# Patient Record
Sex: Male | Born: 1965
Health system: Southern US, Community
[De-identification: ages and names within clinical notes are randomized; demographics above are authoritative.]

## PROBLEM LIST (undated history)

## (undated) DIAGNOSIS — E785 Hyperlipidemia, unspecified: Secondary | ICD-10-CM

## (undated) DIAGNOSIS — M1990 Unspecified osteoarthritis, unspecified site: Secondary | ICD-10-CM

## (undated) DIAGNOSIS — N400 Enlarged prostate without lower urinary tract symptoms: Secondary | ICD-10-CM

## (undated) DIAGNOSIS — I83819 Varicose veins of unspecified lower extremities with pain: Secondary | ICD-10-CM

## (undated) DIAGNOSIS — I10 Essential (primary) hypertension: Secondary | ICD-10-CM

## (undated) DIAGNOSIS — M419 Scoliosis, unspecified: Secondary | ICD-10-CM

## (undated) HISTORY — PX: OTHER SURGICAL HISTORY: SHX169

## (undated) HISTORY — DX: Benign prostatic hyperplasia without lower urinary tract symptoms: N40.0

## (undated) HISTORY — PX: VASECTOMY: SHX75

## (undated) HISTORY — DX: Varicose veins of unspecified lower extremity with pain: I83.819

## (undated) HISTORY — DX: Scoliosis, unspecified: M41.9

## (undated) HISTORY — DX: Hyperlipidemia, unspecified: E78.5

## (undated) HISTORY — DX: Unspecified osteoarthritis, unspecified site: M19.90

## (undated) HISTORY — DX: Essential (primary) hypertension: I10

## (undated) HISTORY — PX: ENDOVENOUS ABLATION SAPHENOUS VEIN W/ LASER: SUR449

---

## 2010-06-18 ENCOUNTER — Encounter (INDEPENDENT_AMBULATORY_CARE_PROVIDER_SITE_OTHER): Payer: 59 | Admitting: Vascular Surgery

## 2010-06-18 DIAGNOSIS — I83893 Varicose veins of bilateral lower extremities with other complications: Secondary | ICD-10-CM

## 2010-06-19 NOTE — Consult Note (Signed)
NEW PATIENT CONSULTATION  JARREL, KNOKE DOB:  1965-08-11                                       06/18/2010 CHART#:[id number]  HISTORY OF PRESENT ILLNESS:  The patient presents today for evaluation of painful varicosities in his left calf.  He is a healthy, active 45- year-old white male, who has had progressive discomfort in his right calf with prolonged standing.  He has been wearing knee-high compression and this does give mild symptom relief while he is standing.  He is here today for further evaluation.  He does not have any history of DVT or superficial thrombophlebitis.  PAST HISTORY:  Significant for hypertension, elevated cholesterol.  SOCIAL HISTORY:  He is married with 2 children.  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  He does not smoke.  He does have social alcohol consumption.  FAMILY HISTORY:  Significant for atherosclerotic.  REVIEW OF SYSTEMS:  Negative for weight loss or weight gain.  He if 5 feet 8 inches tall and weighs 167 pounds, otherwise negative.  PHYSICAL EXAMINATION:  General:  Well-developed, well-nourished white male appearing stated age in no acute distress.  Vital signs: Blood pressure is 150/91, pulse 76, respirations 16.  HEENT:  Normal. Extremities:  He does have palpable radial and palpable dorsalis pedis pulses bilaterally.  He has no evidence of varicosities in his right leg.  Left leg does have typical calf vein varicosities arising below the knee in the medial aspect of his calf.  He does not have any significant swelling bilaterally.  He underwent screening SonoSite ultrasound by myself and this showed normal-caliber saphenous vein on the right leg.  He does have an enlarged saphenous vein with gross reflux on the left leg extending into the varicosities in his calf.  I discussed options with the patient.  He has been given a prescription for thigh-high graduated compression garments  20-to 30-mmHg and instructed on the use of these.  I do feel he would be an excellent candidate for laser ablation of the saphenous vein and stab phlebectomy and tributary varicosities for relief of symptoms should he fail conservative treatment.  He will continue with elevation as he is currently doing.  Hopefully with thigh-high compression, he will have better symptom relief.  We will see him again 3 months for a formal venous duplex and for further discussion.    Larina Earthly, M.D. Electronically Signed  TFE/MEDQ  D:  06/18/2010  T:  06/19/2010  Job:  [id number]

## 2010-09-24 ENCOUNTER — Ambulatory Visit: Payer: 59 | Admitting: Vascular Surgery

## 2010-10-15 ENCOUNTER — Ambulatory Visit: Payer: 59 | Admitting: Vascular Surgery

## 2010-10-29 ENCOUNTER — Encounter (INDEPENDENT_AMBULATORY_CARE_PROVIDER_SITE_OTHER): Payer: 59

## 2010-10-29 ENCOUNTER — Ambulatory Visit (INDEPENDENT_AMBULATORY_CARE_PROVIDER_SITE_OTHER): Payer: 59 | Admitting: Vascular Surgery

## 2010-10-29 DIAGNOSIS — I83893 Varicose veins of bilateral lower extremities with other complications: Secondary | ICD-10-CM

## 2010-10-29 DIAGNOSIS — M79609 Pain in unspecified limb: Secondary | ICD-10-CM

## 2010-10-30 NOTE — Assessment & Plan Note (Signed)
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  He has been compliant with compression garments, reports he continues to have difficulty despite the compression garment usage.  He stands for long periods of time and has difficulty related to leg pain and also cycles for exercise and has difficulty with this, as well, due to leg pain with prolonged cycling. He did undergo formal venous duplex today, and this does show reflux throughout his great saphenous vein extending into the varicosity in his medial calf.  Fortunately, he has a normal deep system with no evidence of reflux.  He has clearly failed conservative treatment and have recommended left leg laser ablation of his great saphenous vein from just below his knee to just above the saphenofemoral junction and stab phlebectomy of tributary varicosities.  I discussed the procedure to include potential rare risk of DVT.  He wishes to proceed when we can get him on our schedule.    Larina Earthly, M.D. Electronically Signed  TFE/MEDQ  D:  10/29/2010  T:  10/30/2010  Job:  [id number]

## 2010-11-13 NOTE — Procedures (Unsigned)
LOWER EXTREMITY VENOUS REFLUX EXAM  INDICATION:  Left lower extremity pain with varicose veins.  EXAM:  Using color-flow imaging and pulse Doppler spectral analysis, the left common femoral, superficial femoral, popliteal, posterior tibial, greater and lesser saphenous veins are evaluated.  There is no evidence suggesting deep venous insufficiency in the left lower extremity.  The left saphenofemoral junction is not competent with Reflux of >532milliseconds. The left GSV is not competent with Reflux of >574milliseconds with the caliber as described below.  The left proximal short saphenous vein demonstrates competency.  GSV Diameter (used if found to be incompetent only)                                           Right    Left Proximal Greater Saphenous Vein           cm       0.73 cm Proximal-to-mid-thigh                     cm       0.68 cm Mid thigh                                 cm       0.60 cm Mid-distal thigh                          cm       cm Distal thigh                              cm       0.63 cm Knee                                      cm       0.62 cm  IMPRESSION: 1. The left great saphenous vein is not competent with reflux     >545milliseconds. 2. The left greater saphenous vein is not tortuous. 3. The left deep venous system is competent. 4. The left small saphenous vein is competent.  ___________________________________________ Larina Earthly, M.D.  SH/MEDQ  D:  10/29/2010  T:  10/29/2010  Job:  [id number]

## 2010-11-21 ENCOUNTER — Encounter: Payer: Self-pay | Admitting: Vascular Surgery

## 2010-12-04 ENCOUNTER — Encounter: Payer: Self-pay | Admitting: Vascular Surgery

## 2010-12-04 ENCOUNTER — Ambulatory Visit (INDEPENDENT_AMBULATORY_CARE_PROVIDER_SITE_OTHER): Payer: 59 | Admitting: Vascular Surgery

## 2010-12-04 VITALS — BP 124/78 | HR 75 | Resp 18 | Ht 68.0 in | Wt 165.0 lb

## 2010-12-04 DIAGNOSIS — I83893 Varicose veins of bilateral lower extremities with other complications: Secondary | ICD-10-CM

## 2010-12-04 NOTE — Progress Notes (Deleted)
Subjective:     Patient ID: Kyle Schneider, male   DOB: 01-30-1966, 45 y.o.   MRN: [id number]  HPI   Review of Systems     Objective:   Physical Exam     Assessment:     ***    Plan:     ***

## 2010-12-04 NOTE — Progress Notes (Signed)
Laser Ablation Procedure      Date: 12/04/2010    Kyle Schneider DOB:1965/04/30  Consent signed: Yes  Surgeon:T.F. Christy Friede  Procedure: Laser Ablation: left Greater Saphenous Vein  BP 124/78  Pulse 75  Resp 18  Ht 5\' 8"  (1.727 m)  Wt 165 lb (74.844 kg)  BMI 25.09 kg/m2  Start time: 1:30PM   End time: 3:05 PM  Tumescent Anesthesia: 425 cc 0.9% NaCl with 50 cc Lidocaine HCL with 1% Epi and 15 cc 8.4% NaHCO3  Local Anesthesia: 2 cc Lidocaine HCL and NaHCO3 (ratio 2:1)   Continuous Mode: 15 watts    Total Energy 2315   Total time 2:34    Stab Phlebectomy: 10-20  Sites: Calf  Patient tolerated procedure well: Yes    .Description of Procedure:  After marking the course of the saphenous vein and the secondary varicosities in the standing position, the patient was placed on the operating table in the supine position, and the the left leg was prepped and draped in sterile fashion. Local anesthetic was administered, and under ultrasound guidance the saphenous vein was accessed with a micro needle and guide wire; then the micro puncture sheath was placed. A guide wire was inserted to the saphenofemoral junction, followed by a 5 french sheath.  The position of the sheath and then the laser fiber below the junction was confirmed using the ultrasound and visualization of the aiming beam.  Tumescent anesthesia was administered along the course of the saphenous vein using ultrasound guidance. Protective laser glasses were placed on the patient, and the laser was fired at at 15 watt continuous mode.  For a total of 2315 joules.  A steri strip was applied to the puncture site.  The patient was then put into Trendelenburg position.  Local anesthetic was utilized overlying the marked varicosities.  Greater than 10-20  stab wounds were made using the tip of an 11 blade; and using the vein hook,  The phlebectomies were performed using a hemostat to avulse these varicosities.  Adequate hemostasis was  achieved, and steri strips were applied to the stab wound.      ABD pads and thigh high compression stockings were applied.  Ace wrap bandages were applied over the phlebectomy sites and at the top of the saphenofemoral junction.  Blood loss was less than 15 cc.  The patient ambulated out of the operating room having tolerated the procedure well.  The patient underwent ablation from his proximal calf to saphenofemoral junction and 10 stab phlebectomy sites with no immediate complication.  [name], [name]  [name] F

## 2010-12-04 NOTE — Progress Notes (Deleted)
Subjective:     Patient ID: Kyle Schneider, male   DOB: [birth year], 45 y.o.   MRN: [id number]  HPILaser Ablation Procedure      Date: 12/04/2010    Kyle Schneider DOB:13-Aug-1965  Consent signed: Yes  Surgeon:T.F. Early  Procedure: Laser Ablation: left Greater Saphenous Vein  Start time: 130 PM   End time: ***  Tumescent Anesthesia: *** cc 0.9% NaCl with 50 cc Lidocaine HCL with 1% Epi and 15 cc 8.4% NaHCO3  Local Anesthesia: *** cc Lidocaine HCL and NaHCO3 (ratio 2:1)   Continuous Mode: 15 Watts     Total Energy ***   Total time ***   Stab Phlebectomy:  Sites: Calf  Patient tolerated procedure well: {yes/no:20286}  Notes: ***  .   Review of Systems     Objective:   Physical Exam       Assessment:     ***    Plan:     ***

## 2010-12-05 ENCOUNTER — Telehealth: Payer: Self-pay | Admitting: *Deleted

## 2010-12-05 ENCOUNTER — Encounter: Payer: Self-pay | Admitting: Vascular Surgery

## 2010-12-05 NOTE — Telephone Encounter (Signed)
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  Reminded Dr. Jeanie Sewer to keep compression dressing on for 48 hours then wear compression hose daytime for 2 weeks.  Reminded Dr. Jeanie Sewer of post ablation venous duplex and follow up with Dr. Arbie Cookey on 12-11-2010.

## 2010-12-10 ENCOUNTER — Encounter: Payer: Self-pay | Admitting: Vascular Surgery

## 2010-12-11 ENCOUNTER — Ambulatory Visit (INDEPENDENT_AMBULATORY_CARE_PROVIDER_SITE_OTHER): Payer: 59 | Admitting: Vascular Surgery

## 2010-12-11 ENCOUNTER — Ambulatory Visit (INDEPENDENT_AMBULATORY_CARE_PROVIDER_SITE_OTHER): Payer: 59

## 2010-12-11 ENCOUNTER — Encounter: Payer: Self-pay | Admitting: Vascular Surgery

## 2010-12-11 VITALS — BP 115/74 | HR 70 | Resp 20 | Ht 68.0 in | Wt 167.0 lb

## 2010-12-11 DIAGNOSIS — Z48812 Encounter for surgical aftercare following surgery on the circulatory system: Secondary | ICD-10-CM

## 2010-12-11 DIAGNOSIS — I83893 Varicose veins of bilateral lower extremities with other complications: Secondary | ICD-10-CM

## 2010-12-11 NOTE — Progress Notes (Signed)
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 He has had mild discomfort related to the procedure. He has been compliant with his compression garments.  Physical exam: Mild bruising in the thigh ablation site and the catheter phlebectomy site. All phlebectomy sites healing well.  Venous duplex: Successful ablation of left great saphenous vein, no evidence of DVT,  Impression and plan: Successful treatment of left leg venous hypertension. Followup on an when necessary basis.

## 2010-12-17 NOTE — Procedures (Unsigned)
DUPLEX DEEP VENOUS EXAM - LOWER EXTREMITY  INDICATION:  One week status post left GSV ablation.  HISTORY:  Edema:  No. Trauma/Surgery:  Left GSV ablation. Pain:  Minimal. PE:  No. Previous DVT:  No. Anticoagulants:  No. Other:  DUPLEX EXAM:               CFV   SFV   PopV  PTV    GSV               R  L  R  L  R  L  R   L  R  L Thrombosis       o     o     o      o     + Spontaneous      +     +     +            o Phasic           +     +     +            o Augmentation     +     +     +      +     o Compressible     +     +     +      + Competent        +     +     +      +     o  Legend:  + - yes  o - no  p - partial  D - decreased  IMPRESSION: 1. Successful ablation of left greater saphenous vein approximately 2     cm distal to the junction and through the proximal calf insertion     point. 2. No deep venous involvement was observed.   _____________________________ Larina Earthly, M.D.  LT/MEDQ  D:  12/11/2010  T:  12/11/2010  Job:  [id number]

## 2010-12-24 ENCOUNTER — Telehealth: Payer: Self-pay | Admitting: *Deleted

## 2010-12-24 NOTE — Telephone Encounter (Signed)
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  States he is experiencing no swelling in the left leg.  States he has decreased his exercise level and intensity.  States he is using Ibuprofen and ice compresses to manage the left leg pain with success.  Will review pt.'s concern with Dr. Arbie Cookey on 12-24-10.   [name], [name]

## 2010-12-26 ENCOUNTER — Telehealth: Payer: Self-pay | Admitting: *Deleted

## 2010-12-26 NOTE — Telephone Encounter (Signed)
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    Dr. Jeanie Sewer states that he has been wearing knee high compression, decreasing his exercise regimen, and taking Celebrex twice daily for the past week.  I communicated this message to Dr. Arbie Cookey.  Dr. Jeanie Sewer to call back if he has further questions or problems.    [name], [name]

## 2011-03-24 ENCOUNTER — Encounter: Payer: Self-pay | Admitting: Vascular Surgery

## 2015-08-15 DIAGNOSIS — R350 Frequency of micturition: Secondary | ICD-10-CM | POA: Insufficient documentation

## 2015-08-15 DIAGNOSIS — E782 Mixed hyperlipidemia: Secondary | ICD-10-CM | POA: Insufficient documentation

## 2015-08-15 DIAGNOSIS — R002 Palpitations: Secondary | ICD-10-CM

## 2015-08-15 DIAGNOSIS — K589 Irritable bowel syndrome without diarrhea: Secondary | ICD-10-CM | POA: Insufficient documentation

## 2015-08-15 DIAGNOSIS — M5137 Other intervertebral disc degeneration, lumbosacral region: Secondary | ICD-10-CM

## 2015-08-15 DIAGNOSIS — J309 Allergic rhinitis, unspecified: Secondary | ICD-10-CM

## 2015-08-15 DIAGNOSIS — N401 Enlarged prostate with lower urinary tract symptoms: Secondary | ICD-10-CM | POA: Insufficient documentation

## 2015-08-15 DIAGNOSIS — N3642 Intrinsic sphincter deficiency (ISD): Secondary | ICD-10-CM

## 2015-08-15 DIAGNOSIS — F419 Anxiety disorder, unspecified: Secondary | ICD-10-CM

## 2015-08-15 DIAGNOSIS — M51379 Other intervertebral disc degeneration, lumbosacral region without mention of lumbar back pain or lower extremity pain: Secondary | ICD-10-CM

## 2015-08-15 DIAGNOSIS — E663 Overweight: Secondary | ICD-10-CM

## 2015-08-15 DIAGNOSIS — K219 Gastro-esophageal reflux disease without esophagitis: Secondary | ICD-10-CM

## 2015-08-15 DIAGNOSIS — N433 Hydrocele, unspecified: Secondary | ICD-10-CM | POA: Insufficient documentation

## 2015-08-15 DIAGNOSIS — H409 Unspecified glaucoma: Secondary | ICD-10-CM

## 2015-08-15 DIAGNOSIS — M2170 Unequal limb length (acquired), unspecified site: Secondary | ICD-10-CM

## 2015-08-15 DIAGNOSIS — D126 Benign neoplasm of colon, unspecified: Secondary | ICD-10-CM | POA: Insufficient documentation

## 2015-08-15 HISTORY — DX: Unequal limb length (acquired), unspecified site: M21.70

## 2015-08-15 HISTORY — DX: Overweight: E66.3

## 2015-08-15 HISTORY — DX: Irritable bowel syndrome, unspecified: K58.9

## 2015-08-15 HISTORY — DX: Allergic rhinitis, unspecified: J30.9

## 2015-08-15 HISTORY — DX: Benign prostatic hyperplasia with lower urinary tract symptoms: N40.1

## 2015-08-15 HISTORY — DX: Benign neoplasm of colon, unspecified: D12.6

## 2015-08-15 HISTORY — DX: Palpitations: R00.2

## 2015-08-15 HISTORY — DX: Intrinsic sphincter deficiency (ISD): N36.42

## 2015-08-15 HISTORY — DX: Unspecified glaucoma: H40.9

## 2015-08-15 HISTORY — DX: Hydrocele, unspecified: N43.3

## 2015-08-15 HISTORY — DX: Anxiety disorder, unspecified: F41.9

## 2015-08-15 HISTORY — DX: Other intervertebral disc degeneration, lumbosacral region: M51.37

## 2015-08-15 HISTORY — DX: Other intervertebral disc degeneration, lumbosacral region without mention of lumbar back pain or lower extremity pain: M51.379

## 2015-08-15 HISTORY — DX: Gastro-esophageal reflux disease without esophagitis: K21.9

## 2016-05-30 DIAGNOSIS — E119 Type 2 diabetes mellitus without complications: Secondary | ICD-10-CM | POA: Insufficient documentation

## 2016-05-30 HISTORY — DX: Type 2 diabetes mellitus without complications: E11.9

## 2016-06-18 DIAGNOSIS — K648 Other hemorrhoids: Secondary | ICD-10-CM | POA: Insufficient documentation

## 2016-06-18 HISTORY — DX: Other hemorrhoids: K64.8

## 2016-07-24 DIAGNOSIS — Z79899 Other long term (current) drug therapy: Secondary | ICD-10-CM | POA: Insufficient documentation

## 2016-07-24 HISTORY — DX: Other long term (current) drug therapy: Z79.899

## 2017-02-11 ENCOUNTER — Encounter: Payer: Self-pay | Admitting: Cardiology

## 2017-02-11 ENCOUNTER — Ambulatory Visit (INDEPENDENT_AMBULATORY_CARE_PROVIDER_SITE_OTHER): Payer: BLUE CROSS/BLUE SHIELD | Admitting: Cardiology

## 2017-02-11 VITALS — BP 132/72 | HR 80 | Resp 10 | Ht 68.0 in | Wt 177.8 lb

## 2017-02-11 DIAGNOSIS — R7303 Prediabetes: Secondary | ICD-10-CM | POA: Diagnosis not present

## 2017-02-11 DIAGNOSIS — I10 Essential (primary) hypertension: Secondary | ICD-10-CM

## 2017-02-11 DIAGNOSIS — R0789 Other chest pain: Secondary | ICD-10-CM

## 2017-02-11 DIAGNOSIS — R079 Chest pain, unspecified: Secondary | ICD-10-CM

## 2017-02-11 DIAGNOSIS — E785 Hyperlipidemia, unspecified: Secondary | ICD-10-CM

## 2017-02-11 HISTORY — DX: Hyperlipidemia, unspecified: E78.5

## 2017-02-11 HISTORY — DX: Other chest pain: R07.89

## 2017-02-11 HISTORY — DX: Prediabetes: R73.03

## 2017-02-11 HISTORY — DX: Essential (primary) hypertension: I10

## 2017-02-11 NOTE — Progress Notes (Signed)
Cardiology Consultation:    Date:  02/11/2017   ID:  [name], DOB 02-01-1966, MRN [id number]  PCP:  Raina Mina., MD  Cardiologist:  Jenne Campus, MD   Referring MD: Raina Mina., MD   Chief Complaint  Patient presents with  . Chest Pain    Gone at this point  Chest pain  History of Present Illness:    Kyle Schneider is a 51 y.o. male who is being seen today for the evaluation of chest pain at the request of Raina Mina., MD.  Wilburn Mylar he started having chest pain it happened in the afternoon he described pain is located in the lower portion of his sternum without radiation grades the pain as a 3 in scale up to 10.  No radiation no sweating or shortness of breath associated with this sensation on and off lasting for a few minutes every time.  He eventually ended up getting to the emergency room.  There were no aggravating or relieving symptoms.  He had a normal EKG done in the emergency room, troponin I was checked was also normal.  Eventually he ended up coming to me this morning.  Doing well today with no symptoms.  Good night and slept well.  Obviously very concerned about his symptoms.  His risk factors for coronary artery disease include hypertension, prolonged diabetes, dyslipidemia.  There is also some family history of coronary artery disease.  Past Medical History:  Diagnosis Date  . Arthritis   . BPH (benign prostatic hyperplasia)   . Hyperlipidemia   . Hypertension   . Scoliosis   . Varicose veins of leg with pain left leg    Past Surgical History:  Procedure Laterality Date  . ENDOVENOUS ABLATION SAPHENOUS VEIN W/ LASER  12-04-2010 Left Greater SaphenousVein   . STAB PHLEBECTOMY  12-04-2010 LEFT LEG 10-20 INCISIONS  . VASECTOMY      Current Medications: Current Meds  Medication Sig  . dexlansoprazole (DEXILANT) 60 MG capsule Take 60 mg by mouth daily.  Marland Kitchen olmesartan (BENICAR) 20 MG tablet Take 20 mg by mouth daily.  . rosuvastatin (CRESTOR) 5  MG tablet Take 5 mg by mouth daily.  . tadalafil (CIALIS) 5 MG tablet Take 2.5 mg by mouth 2 (two) times daily.  . TRAVATAN Z 0.004 % SOLN ophthalmic solution instill 1 drop in both eyes every day at bedtime     Allergies:   Patient has no known allergies.   Social History   Social History  . Marital status: Unknown    Spouse name: N/A  . Number of children: N/A  . Years of education: N/A   Social History Main Topics  . Smoking status: Never Smoker  . Smokeless tobacco: Never Used  . Alcohol use Yes     Comment: 3-5 glasses of wine/beer weekly  . Drug use: No  . Sexual activity: Not Asked   Other Topics Concern  . None   Social History Narrative  . None     Family History: The patient's family history includes Alzheimer's disease in his mother; CAD in his father; Heart disease in his paternal grandfather; Lymphoma in his sister. ROS:   Please see the history of present illness.    All 14 point review of systems negative except as described per history of present illness.  EKGs/Labs/Other Studies Reviewed:    The following studies were reviewed today: I reviewed records from hospital.  EKG:  EKG is  ordered today.  The ekg  ordered today demonstrates showed normal sinus rhythm, normal interval, normal QS complex duration and morphology.  Recent Labs: No results found for requested labs within last 8760 hours.  Recent Lipid Panel No results found for: CHOL, TRIG, HDL, CHOLHDL, VLDL, LDLCALC, LDLDIRECT  Physical Exam:    VS:  BP 132/72   Pulse 80   Resp 10   Ht 5\' 8"  (1.727 m)   Wt 177 lb 12.8 oz (80.6 kg)   BMI 27.03 kg/m     Wt Readings from Last 3 Encounters:  02/11/17 177 lb 12.8 oz (80.6 kg)  12/11/10 167 lb (75.8 kg)  12/04/10 165 lb (74.8 kg)     GEN:  Well nourished, well developed in no acute distress HEENT: Normal NECK: No JVD; No carotid bruits LYMPHATICS: No lymphadenopathy CARDIAC: RRR, no murmurs, no rubs, no gallops RESPIRATORY:  Clear  to auscultation without rales, wheezing or rhonchi  ABDOMEN: Soft, non-tender, non-distended MUSCULOSKELETAL:  No edema; No deformity  SKIN: Warm and dry NEUROLOGIC:  Alert and oriented x 3 PSYCHIATRIC:  Normal affect   ASSESSMENT:    1. Chest pain, unspecified type   2. Atypical chest pain   3. Borderline diabetes   4. Dyslipidemia   5. Essential hypertension    PLAN:    In order of problems listed above:  1. Chest pain which is atypical.  But he does have significant risk factors for coronary artery disease.  Troponin eyes were normal.  He will be scheduled to have a stress test.  We will do it today.  At the moment of this dictation that he already had stress test done he walked 12 months and have negative test.  I think we need to reorient ourselves on the modification of his risk factors.  We already talked about management of his high blood pressure borderline diabetes, good diet and exercises on a regular basis. 2. 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  He is watching his diet and he plan to exercise he will be more aggressively.  He does have chronic back problem with back exercises somewhat difficult. 3. Dyslipidemia: He is taking 5 mg of Crestor I asked him to increase the dose to 10 mg. 4. Essential hypertension: Blood pressures well controlled continue present management.   Medication Adjustments/Labs and Tests Ordered: Current medicines are reviewed at length with the patient today.  Concerns regarding medicines are outlined above.  Orders Placed This Encounter  Procedures  . Exercise Tolerance Test   No orders of the defined types were placed in this encounter.   Signed, Park Liter, MD, [facility]. 02/11/2017 11:53 AM    [facility]

## 2017-02-11 NOTE — Patient Instructions (Signed)
Medication Instructions:  Your physician recommends that you continue on your current medications as directed. Please refer to the Current Medication list given to you today.  1. Avoid all over-the-counter antihistamines except Claritin/Loratadine and Zyrtec/Cetrizine. 2. Avoid all combination including cold sinus allergies flu decongestant and sleep medications 3. You can use Robitussin DM Mucinex and Mucinex DM for cough. 4. can use Tylenol aspirin ibuprofen and naproxen but no combinations such as sleep or sinus.  Labwork: None   Testing/Procedures: Your physician has requested that you have an exercise tolerance test. We will have this performed at [facility] today.    Follow-Up: Your physician recommends that you schedule a follow-up appointment in: 1 month   Any Other Special Instructions Will Be Listed Below (If Applicable).  Please note that any paperwork needing to be filled out by the provider will need to be addressed at the front desk prior to seeing the provider. Please note that any paperwork FMLA, Disability or other documents regarding health condition is subject to a $25.00 charge that must be received prior to completion of paperwork in the form of a money order or check.    If you need a refill on your cardiac medications before your next appointment, please call your pharmacy.

## 2017-02-12 NOTE — Addendum Note (Signed)
Addended by: Kathyrn Sheriff on: 02/12/2017 10:38 AM   Modules accepted: Orders

## 2017-05-18 DIAGNOSIS — H9193 Unspecified hearing loss, bilateral: Secondary | ICD-10-CM

## 2017-05-18 DIAGNOSIS — H6993 Unspecified Eustachian tube disorder, bilateral: Secondary | ICD-10-CM

## 2017-05-18 DIAGNOSIS — H6983 Other specified disorders of Eustachian tube, bilateral: Secondary | ICD-10-CM

## 2017-05-18 HISTORY — DX: Other specified disorders of eustachian tube, bilateral: H69.83

## 2017-05-18 HISTORY — DX: Unspecified eustachian tube disorder, bilateral: H69.93

## 2017-05-18 HISTORY — DX: Unspecified hearing loss, bilateral: H91.93

## 2019-05-27 DIAGNOSIS — Z01812 Encounter for preprocedural laboratory examination: Secondary | ICD-10-CM | POA: Diagnosis not present

## 2019-06-03 DIAGNOSIS — K208 Other esophagitis without bleeding: Secondary | ICD-10-CM | POA: Diagnosis not present

## 2019-06-03 DIAGNOSIS — K2970 Gastritis, unspecified, without bleeding: Secondary | ICD-10-CM | POA: Diagnosis not present

## 2019-06-03 DIAGNOSIS — Z79899 Other long term (current) drug therapy: Secondary | ICD-10-CM | POA: Diagnosis not present

## 2019-06-03 DIAGNOSIS — K2960 Other gastritis without bleeding: Secondary | ICD-10-CM | POA: Diagnosis not present

## 2019-06-03 DIAGNOSIS — N400 Enlarged prostate without lower urinary tract symptoms: Secondary | ICD-10-CM | POA: Diagnosis not present

## 2019-06-03 DIAGNOSIS — K648 Other hemorrhoids: Secondary | ICD-10-CM | POA: Diagnosis not present

## 2019-06-03 DIAGNOSIS — I10 Essential (primary) hypertension: Secondary | ICD-10-CM | POA: Diagnosis not present

## 2019-06-03 DIAGNOSIS — E119 Type 2 diabetes mellitus without complications: Secondary | ICD-10-CM | POA: Diagnosis not present

## 2019-06-03 DIAGNOSIS — K449 Diaphragmatic hernia without obstruction or gangrene: Secondary | ICD-10-CM | POA: Diagnosis not present

## 2019-06-03 DIAGNOSIS — K219 Gastro-esophageal reflux disease without esophagitis: Secondary | ICD-10-CM | POA: Diagnosis not present

## 2019-06-03 DIAGNOSIS — Z86010 Personal history of colonic polyps: Secondary | ICD-10-CM | POA: Diagnosis not present

## 2019-06-03 DIAGNOSIS — E785 Hyperlipidemia, unspecified: Secondary | ICD-10-CM | POA: Diagnosis not present

## 2019-06-03 DIAGNOSIS — Z800 Family history of malignant neoplasm of digestive organs: Secondary | ICD-10-CM | POA: Diagnosis not present

## 2019-06-03 DIAGNOSIS — Z7984 Long term (current) use of oral hypoglycemic drugs: Secondary | ICD-10-CM | POA: Diagnosis not present

## 2019-06-03 LAB — HM COLONOSCOPY

## 2019-06-29 DIAGNOSIS — Z0000 Encounter for general adult medical examination without abnormal findings: Secondary | ICD-10-CM | POA: Diagnosis not present

## 2019-06-29 DIAGNOSIS — E782 Mixed hyperlipidemia: Secondary | ICD-10-CM | POA: Diagnosis not present

## 2019-06-29 DIAGNOSIS — R748 Abnormal levels of other serum enzymes: Secondary | ICD-10-CM | POA: Diagnosis not present

## 2019-06-29 DIAGNOSIS — D126 Benign neoplasm of colon, unspecified: Secondary | ICD-10-CM | POA: Diagnosis not present

## 2019-06-29 DIAGNOSIS — E119 Type 2 diabetes mellitus without complications: Secondary | ICD-10-CM | POA: Diagnosis not present

## 2019-06-29 DIAGNOSIS — L209 Atopic dermatitis, unspecified: Secondary | ICD-10-CM

## 2019-06-29 DIAGNOSIS — K760 Fatty (change of) liver, not elsewhere classified: Secondary | ICD-10-CM | POA: Insufficient documentation

## 2019-06-29 DIAGNOSIS — N401 Enlarged prostate with lower urinary tract symptoms: Secondary | ICD-10-CM | POA: Diagnosis not present

## 2019-06-29 HISTORY — DX: Fatty (change of) liver, not elsewhere classified: K76.0

## 2019-06-29 HISTORY — DX: Atopic dermatitis, unspecified: L20.9

## 2019-07-04 DIAGNOSIS — S93491A Sprain of other ligament of right ankle, initial encounter: Secondary | ICD-10-CM | POA: Diagnosis not present

## 2019-07-08 DIAGNOSIS — H40003 Preglaucoma, unspecified, bilateral: Secondary | ICD-10-CM | POA: Diagnosis not present

## 2019-07-20 DIAGNOSIS — R748 Abnormal levels of other serum enzymes: Secondary | ICD-10-CM | POA: Diagnosis not present

## 2019-07-20 DIAGNOSIS — K7689 Other specified diseases of liver: Secondary | ICD-10-CM | POA: Diagnosis not present

## 2019-07-22 DIAGNOSIS — R748 Abnormal levels of other serum enzymes: Secondary | ICD-10-CM | POA: Diagnosis not present

## 2019-10-26 DIAGNOSIS — D2261 Melanocytic nevi of right upper limb, including shoulder: Secondary | ICD-10-CM | POA: Diagnosis not present

## 2019-10-26 DIAGNOSIS — L821 Other seborrheic keratosis: Secondary | ICD-10-CM | POA: Diagnosis not present

## 2019-10-26 DIAGNOSIS — D225 Melanocytic nevi of trunk: Secondary | ICD-10-CM | POA: Diagnosis not present

## 2019-10-26 DIAGNOSIS — L812 Freckles: Secondary | ICD-10-CM | POA: Diagnosis not present

## 2020-01-11 DIAGNOSIS — K760 Fatty (change of) liver, not elsewhere classified: Secondary | ICD-10-CM | POA: Diagnosis not present

## 2020-01-11 DIAGNOSIS — I10 Essential (primary) hypertension: Secondary | ICD-10-CM | POA: Diagnosis not present

## 2020-01-11 DIAGNOSIS — K219 Gastro-esophageal reflux disease without esophagitis: Secondary | ICD-10-CM | POA: Diagnosis not present

## 2020-01-11 DIAGNOSIS — E119 Type 2 diabetes mellitus without complications: Secondary | ICD-10-CM | POA: Diagnosis not present

## 2020-02-10 DIAGNOSIS — H40023 Open angle with borderline findings, high risk, bilateral: Secondary | ICD-10-CM | POA: Diagnosis not present

## 2020-02-22 DIAGNOSIS — M25572 Pain in left ankle and joints of left foot: Secondary | ICD-10-CM | POA: Diagnosis not present

## 2020-03-26 DIAGNOSIS — Z77122 Contact with and (suspected) exposure to noise: Secondary | ICD-10-CM | POA: Diagnosis not present

## 2020-03-26 DIAGNOSIS — H9312 Tinnitus, left ear: Secondary | ICD-10-CM | POA: Diagnosis not present

## 2020-03-26 DIAGNOSIS — H6121 Impacted cerumen, right ear: Secondary | ICD-10-CM | POA: Diagnosis not present

## 2020-03-26 DIAGNOSIS — H903 Sensorineural hearing loss, bilateral: Secondary | ICD-10-CM | POA: Diagnosis not present

## 2020-06-05 ENCOUNTER — Other Ambulatory Visit: Payer: Self-pay

## 2020-06-05 DIAGNOSIS — E785 Hyperlipidemia, unspecified: Secondary | ICD-10-CM | POA: Insufficient documentation

## 2020-06-05 DIAGNOSIS — I83819 Varicose veins of unspecified lower extremities with pain: Secondary | ICD-10-CM | POA: Insufficient documentation

## 2020-06-05 DIAGNOSIS — M419 Scoliosis, unspecified: Secondary | ICD-10-CM | POA: Insufficient documentation

## 2020-06-05 DIAGNOSIS — N400 Enlarged prostate without lower urinary tract symptoms: Secondary | ICD-10-CM | POA: Insufficient documentation

## 2020-06-05 DIAGNOSIS — I10 Essential (primary) hypertension: Secondary | ICD-10-CM | POA: Insufficient documentation

## 2020-06-05 DIAGNOSIS — M1990 Unspecified osteoarthritis, unspecified site: Secondary | ICD-10-CM | POA: Insufficient documentation

## 2020-06-06 ENCOUNTER — Other Ambulatory Visit: Payer: Self-pay

## 2020-06-06 ENCOUNTER — Ambulatory Visit (INDEPENDENT_AMBULATORY_CARE_PROVIDER_SITE_OTHER): Payer: BC Managed Care – PPO | Admitting: Cardiology

## 2020-06-06 ENCOUNTER — Encounter: Payer: Self-pay | Admitting: Cardiology

## 2020-06-06 VITALS — BP 108/68 | HR 73 | Ht 68.0 in | Wt 179.0 lb

## 2020-06-06 DIAGNOSIS — R079 Chest pain, unspecified: Secondary | ICD-10-CM

## 2020-06-06 DIAGNOSIS — R0789 Other chest pain: Secondary | ICD-10-CM

## 2020-06-06 DIAGNOSIS — R7303 Prediabetes: Secondary | ICD-10-CM

## 2020-06-06 DIAGNOSIS — I10 Essential (primary) hypertension: Secondary | ICD-10-CM

## 2020-06-06 DIAGNOSIS — R002 Palpitations: Secondary | ICD-10-CM

## 2020-06-06 DIAGNOSIS — E785 Hyperlipidemia, unspecified: Secondary | ICD-10-CM

## 2020-06-06 MED ORDER — METOPROLOL TARTRATE 100 MG PO TABS
ORAL_TABLET | ORAL | 0 refills | Status: DC
Start: 2020-06-06 — End: 2021-06-15

## 2020-06-06 NOTE — Patient Instructions (Signed)
Medication Instructions:  Your physician recommends that you continue on your current medications as directed. Please refer to the Current Medication list given to you today.  *If you need a refill on your cardiac medications before your next appointment, please call your pharmacy*   Lab Work: Your physician recommends that you return for lab work today: troponin   Your physician recommends that you return for lab work 3-7 days before ct : bmp   If you have labs (blood work) drawn today and your tests are completely normal, you will receive your results only by: Marland Kitchen MyChart Message (if you have MyChart) OR . A paper copy in the mail If you have any lab test that is abnormal or we need to change your treatment, we will call you to review the results.   Testing/Procedures: Your cardiac CT will be scheduled at one of the below locations:   [facility] [address] [place], [place] [address] [phone]  [place] [address] [place], [place] [address] [phone]  If scheduled at [facility], please arrive at the [facility] main entrance (entrance A) of [facility] 30 minutes prior to test start time. Proceed to the [facility] Radiology Department (first floor) to check-in and test prep.  If scheduled at [facility], please arrive 15 mins early for check-in and test prep.  Please follow these instructions carefully (unless otherwise directed):  Hold all erectile dysfunction medications at least 3 days (72 hrs) prior to test.  On the Night Before the Test: . Be sure to Drink plenty of water. . Do not consume any caffeinated/decaffeinated beverages or chocolate 12 hours prior to your test. . Do not take any antihistamines 12 hours prior to your test. On the Day of the Test: . Drink plenty of water until 1 hour prior to the test. . Do not eat any food 4 hours prior  to the test. . You may take your regular medications prior to the test.  . Take metoprolol (Lopressor) two hours prior to test. . HOLD Metformin the day of the test and 48 hours after.          After the Test: . Drink plenty of water. . After receiving IV contrast, you may experience a mild flushed feeling. This is normal. . On occasion, you may experience a mild rash up to 24 hours after the test. This is not dangerous. If this occurs, you can take Benadryl 25 mg and increase your fluid intake. . If you experience trouble breathing, this can be serious. If it is severe call 911 IMMEDIATELY. If it is mild, please call our office. . If you take any of these medications: Glipizide/Metformin, Avandament, Glucavance, please do not take 48 hours after completing test unless otherwise instructed.   Once we have confirmed authorization from your insurance company, we will call you to set up a date and time for your test. Based on how quickly your insurance processes prior authorizations requests, please allow up to 4 weeks to be contacted for scheduling your Cardiac CT appointment. Be advised that routine Cardiac CT appointments could be scheduled as many as 8 weeks after your provider has ordered it.  For non-scheduling related questions, please contact the cardiac imaging nurse navigator should you have any questions/concerns: Marchia Bond, Cardiac Imaging Nurse Navigator Gordy Clement, Cardiac Imaging Nurse Navigator [facility] Direct Office Dial: [phone]  For scheduling needs, including cancellations and rescheduling, please call [place], [phone].     Follow-Up: At [facility], you and your health needs are our priority.  As part of our continuing mission to provide you with exceptional heart care, we have created designated Provider Care Teams.  These Care Teams include your primary Cardiologist (physician) and Advanced Practice Providers (APPs  -  Physician Assistants and Nurse Practitioners) who all work together to provide you with the care you need, when you need it.  We recommend signing up for the patient portal called "MyChart".  Sign up information is provided on this After Visit Summary.  MyChart is used to connect with patients for Virtual Visits (Telemedicine).  Patients are able to view lab/test results, encounter notes, upcoming appointments, etc.  Non-urgent messages can be sent to your provider as well.   To learn more about what you can do with MyChart, go to [email or web address].    Your next appointment:   6 week(s)  The format for your next appointment:   In Person  Provider:   Jenne Campus, MD   Other Instructions   Cardiac CT Angiogram A cardiac CT angiogram is a procedure to look at the heart and the area around the heart. It may be done to help find the cause of chest pains or other symptoms of heart disease. During this procedure, a substance called contrast dye is injected into the blood vessels in the area to be checked. A large X-ray machine, called a CT scanner, then takes detailed pictures of the heart and the surrounding area. The procedure is also sometimes called a coronary CT angiogram, coronary artery scanning, or CTA. A cardiac CT angiogram allows the health care provider to see how well blood is flowing to and from the heart. The health care provider will be able to see if there are any problems, such as:  Blockage or narrowing of the coronary arteries in the heart.  Fluid around the heart.  Signs of weakness or disease in the muscles, valves, and tissues of the heart. Tell a health care provider about:  Any allergies you have. This is especially important if you have had a previous allergic reaction to contrast dye.  All medicines you are taking, including vitamins, herbs, eye drops, creams, and over-the-counter medicines.  Any blood disorders you have.  Any surgeries you have  had.  Any medical conditions you have.  Whether you are pregnant or may be pregnant.  Any anxiety disorders, chronic pain, or other conditions you have that may increase your stress or prevent you from lying still. What are the risks? Generally, this is a safe procedure. However, problems may occur, including:  Bleeding.  Infection.  Allergic reactions to medicines or dyes.  Damage to other structures or organs.  Kidney damage from the contrast dye that is used.  Increased risk of cancer from radiation exposure. This risk is low. Talk with your health care provider about: ? The risks and benefits of testing. ? How you can receive the lowest dose of radiation. What happens before the procedure?  Wear comfortable clothing and remove any jewelry, glasses, dentures, and hearing aids.  Follow instructions from your health care provider about eating and drinking. This may include: ? For 12 hours before the procedure -- avoid caffeine. This includes tea, coffee, soda, energy drinks, and diet pills. Drink plenty of water or other fluids that do not have caffeine in them. Being well hydrated can prevent complications. ? For 4-6  hours before the procedure -- stop eating and drinking. The contrast dye can cause nausea, but this is less likely if your stomach is empty.  Ask your health care provider about changing or stopping your regular medicines. This is especially important if you are taking diabetes medicines, blood thinners, or medicines to treat problems with erections (erectile dysfunction). What happens during the procedure?  Hair on your chest may need to be removed so that small sticky patches called electrodes can be placed on your chest. These will transmit information that helps to monitor your heart during the procedure.  An IV will be inserted into one of your veins.  You might be given a medicine to control your heart rate during the procedure. This will help to ensure that  good images are obtained.  You will be asked to lie on an exam table. This table will slide in and out of the CT machine during the procedure.  Contrast dye will be injected into the IV. You might feel warm, or you may get a metallic taste in your mouth.  You will be given a medicine called nitroglycerin. This will relax or dilate the arteries in your heart.  The table that you are lying on will move into the CT machine tunnel for the scan.  The person running the machine will give you instructions while the scans are being done. You may be asked to: ? Keep your arms above your head. ? Hold your breath. ? Stay very still, even if the table is moving.  When the scanning is complete, you will be moved out of the machine.  The IV will be removed. The procedure may vary among health care providers and hospitals.   What can I expect after the procedure? After your procedure, it is common to have:  A metallic taste in your mouth from the contrast dye.  A feeling of warmth.  A headache from the nitroglycerin. Follow these instructions at home:  Take over-the-counter and prescription medicines only as told by your health care provider.  If you are told, drink enough fluid to keep your urine pale yellow. This will help to flush the contrast dye out of your body.  Most people can return to their normal activities right after the procedure. Ask your health care provider what activities are safe for you.  It is up to you to get the results of your procedure. Ask your health care provider, or the department that is doing the procedure, when your results will be ready.  Keep all follow-up visits as told by your health care provider. This is important. Contact a health care provider if:  You have any symptoms of allergy to the contrast dye. These include: ? Shortness of breath. ? Rash or hives. ? A racing heartbeat. Summary  A cardiac CT angiogram is a procedure to look at the heart  and the area around the heart. It may be done to help find the cause of chest pains or other symptoms of heart disease.  During this procedure, a large X-ray machine, called a CT scanner, takes detailed pictures of the heart and the surrounding area after a contrast dye has been injected into blood vessels in the area.  Ask your health care provider about changing or stopping your regular medicines before the procedure. This is especially important if you are taking diabetes medicines, blood thinners, or medicines to treat erectile dysfunction.  If you are told, drink enough fluid to keep your urine  pale yellow. This will help to flush the contrast dye out of your body. This information is not intended to replace advice given to you by your health care provider. Make sure you discuss any questions you have with your health care provider. Document Revised: 11/24/2018 Document Reviewed: 11/24/2018 Elsevier Patient Education  2021 [employer].

## 2020-06-06 NOTE — Progress Notes (Signed)
Cardiology Consultation:    Date:  06/06/2020   ID:  Kyle Schneider, DOB 1965-10-17, MRN [id number]  PCP:  Raina Mina., MD  Cardiologist:  Jenne Campus, MD   Referring MD: Raina Mina., MD   Chief Complaint  Patient presents with  . Chest Pain    History of Present Illness:    Kyle Schneider is a 55 y.o. male who is being seen today for the evaluation of chest pain at the request of Raina Mina., MD.  His past medical history significant for essential hypertension, dyslipidemia, nonalcoholic fatty liver, borderline diabetes.  He requested to be seen because of episode of chest pain.  He said for about a week he experiencing tightness in the chest which is not related to exercise.  He graded sensation as moderate, there is no shortness of breath no sweating associated with this.  Taking deep breath may help a little bit.  Duration of the sensations for about half an hour.  At the same time he was able to exercise and walk with no difficulty.  Recently he ended going vacation and have no problem during that time.  At the moment of my interview he is perfectly asymptomatic.  His EKG does not show any changes. Risk factors for coronary artery disease include family history of coronary artery disease with some premature. Dyslipidemia: However well managed. Essential hypertension which is very well controlled. Borderline diabetes which is also very nicely controlled. He does not smoke. I did see him in 2019 where he had a stress test done he walked 12 minutes on the treadmill with no problems.  Past Medical History:  Diagnosis Date  . Allergic rhinitis 08/15/2015  . Anxiety disorder 08/15/2015  . Arthritis   . Atopic dermatitis 06/29/2019  . Atypical chest pain 02/11/2017  . Benign neoplasm of colon 08/15/2015   Formatting of this note might be different from the original. [place].  . Benign prostatic hyperplasia with urinary frequency 08/15/2015  . Bilateral hearing loss 05/18/2017   . Borderline diabetes 02/11/2017  . BPH (benign prostatic hyperplasia)   . DDD (degenerative disc disease), lumbosacral 08/15/2015  . Dyslipidemia 02/11/2017  . Essential hypertension 02/11/2017  . ETD (Eustachian tube dysfunction), bilateral 05/18/2017  . GERD without esophagitis 08/15/2015  . Glaucoma 08/15/2015  . Hemorrhoids, internal, with bleeding 06/18/2016  . High risk medication use 07/24/2016  . Hydrocele of testis 08/15/2015  . Hyperlipidemia   . Hypertension   . IBS (irritable bowel syndrome) 08/15/2015  . Intrinsic sphincter deficiency 08/15/2015  . Nonalcoholic fatty liver disease without nonalcoholic steatohepatitis (NA[facility]) 06/29/2019   Formatting of this note might be different from the original. Abnormal LFTs. [place] with Fatty liver.  Marland Kitchen Overweight 08/15/2015  . Palpitations 08/15/2015  . Scoliosis   . Type 2 diabetes mellitus without complication, without long-term current use of insulin ([place]) 05/30/2016   Formatting of this note might be different from the original. A1C=6.5 on 05/2017  . Unequal leg length 08/15/2015  . Varicose veins of leg with pain left leg    Past Surgical History:  Procedure Laterality Date  . ENDOVENOUS ABLATION SAPHENOUS VEIN W/ LASER  12-04-2010 Left Greater SaphenousVein   . STAB PHLEBECTOMY  12-04-2010 LEFT LEG 10-20 INCISIONS  . VASECTOMY      Current Medications: Current Meds  Medication Sig  . amLODipine (NORVASC) 10 MG tablet Take 5 mg by mouth daily.  . bimatoprost (LUMIGAN) 0.01 % SOLN Place 1 drop into both eyes daily.  Marland Kitchen  esomeprazole (NEXIUM) 20 MG packet Take 20 mg by mouth daily.  . famotidine (PEPCID) 40 MG tablet Take 40 mg by mouth at bedtime.  . metFORMIN (GLUCOPHAGE) 500 MG tablet Take 500 mg by mouth daily.  . metoprolol tartrate (LOPRESSOR) 100 MG tablet Take 2 hours before ct  . olmesartan (BENICAR) 20 MG tablet Take 20 mg by mouth daily.  . rosuvastatin (CRESTOR) 10 MG tablet Take 10 mg by mouth at bedtime.  . tadalafil (CIALIS) 5 MG  tablet Take 2.5 mg by mouth 2 (two) times daily.  . TRAVATAN Z 0.004 % SOLN ophthalmic solution instill 1 drop in both eyes every day at bedtime  . [DISCONTINUED] psyllium (REGULOID) 0.52 g capsule Take 0.52 g by mouth daily.  . [DISCONTINUED] rosuvastatin (CRESTOR) 5 MG tablet Take 5 mg by mouth daily.     Allergies:   Patient has no known allergies.   Social History   Socioeconomic History  . Marital status: Unknown    Spouse name: Not on file  . Number of children: Not on file  . Years of education: Not on file  . Highest education level: Not on file  Occupational History  . Not on file  Tobacco Use  . Smoking status: Never Smoker  . Smokeless tobacco: Never Used  Vaping Use  . Vaping Use: Never used  Substance and Sexual Activity  . Alcohol use: Yes    Comment: 3-5 glasses of wine/beer weekly  . Drug use: No  . Sexual activity: Not on file  Other Topics Concern  . Not on file  Social History Narrative  . Not on file   Social Determinants of Health   Financial Resource Strain: Not on file  Food Insecurity: Not on file  Transportation Needs: Not on file  Physical Activity: Not on file  Stress: Not on file  Social Connections: Not on file     Family History: The patient's family history includes Alzheimer's disease in his mother; CAD in his father; Heart disease in his paternal grandfather; Lymphoma in his sister. ROS:   Please see the history of present illness.    All 14 point review of systems negative except as described per history of present illness.  EKGs/Labs/Other Studies Reviewed:    The following studies were reviewed today:   EKG:  EKG is  ordered today.  The ekg ordered today demonstrates normal sinus rhythm, normal P interval, normal QS complex duration morphology no ST segment changes.  Recent Labs: No results found for requested labs within last 8760 hours.  Recent Lipid Panel No results found for: CHOL, TRIG, HDL, CHOLHDL, VLDL, LDLCALC,  LDLDIRECT  Physical Exam:    VS:  BP 108/68 (BP Location: Right Arm, Patient Position: Sitting)   Pulse 73   Ht 5\' 8"  (1.727 m)   Wt 179 lb (81.2 kg)   SpO2 95%   BMI 27.22 kg/m     Wt Readings from Last 3 Encounters:  06/06/20 179 lb (81.2 kg)  02/11/17 177 lb 12.8 oz (80.6 kg)  12/11/10 167 lb (75.8 kg)     GEN:  Well nourished, well developed in no acute distress HEENT: Normal NECK: No JVD; No carotid bruits LYMPHATICS: No lymphadenopathy CARDIAC: RRR, no murmurs, no rubs, no gallops RESPIRATORY:  Clear to auscultation without rales, wheezing or rhonchi  ABDOMEN: Soft, non-tender, non-distended MUSCULOSKELETAL:  No edema; No deformity  SKIN: Warm and dry NEUROLOGIC:  Alert and oriented x 3 PSYCHIATRIC:  Normal affect   ASSESSMENT:  1. Chest pain of uncertain etiology   2. Atypical chest pain   3. Palpitations   4. Essential hypertension   5. Borderline diabetes   6. Dyslipidemia    PLAN:    In order of problems listed above:  1. Atypical chest pain.  He does have risk factors for coronary artery disease, I asked him to start taking 1 baby aspirin every single day.  I told him to let me know if pain became more frequent.  In terms of evaluating for possibility of coronary artery disease we debated how to perform the test that will be able to ask the question have a problem now.  We elected to proceed with coronary CT. 2. Palpitations denies having any. 3. Essential hypertension well-controlled with his blood pressure 108/68 today. 4. Dyslipidemia I did review his cholesterol which look excellent.  We will continue present management which involve giving Crestor. 5. We did talk about healthy lifestyle need to exercise on the regular basis which she already does likely he is asymptomatic while exercising.   Medication Adjustments/Labs and Tests Ordered: Current medicines are reviewed at length with the patient today.  Concerns regarding medicines are outlined  above.  Orders Placed This Encounter  Procedures  . CT CORONARY MORPH W/CTA COR W/SCORE W/CA W/CM &/OR WO/CM  . CT CORONARY FRACTIONAL FLOW RESERVE DATA PREP  . CT CORONARY FRACTIONAL FLOW RESERVE FLUID ANALYSIS  . Troponin T  . Basic metabolic panel  . EKG 12-Lead   Meds ordered this encounter  Medications  . metoprolol tartrate (LOPRESSOR) 100 MG tablet    Sig: Take 2 hours before ct    Dispense:  1 tablet    Refill:  0    Signed, Park Liter, MD, [facility]. 06/06/2020 2:58 PM    [place]

## 2020-06-06 NOTE — Progress Notes (Signed)
ekg 

## 2020-06-07 LAB — TROPONIN T: Troponin T (Highly Sensitive): <6 ng/L (ref 0–22)

## 2020-07-16 ENCOUNTER — Telehealth (HOSPITAL_COMMUNITY): Payer: Self-pay | Admitting: Emergency Medicine

## 2020-07-16 NOTE — Telephone Encounter (Signed)
Attempted to call patient regarding upcoming cardiac CT appointment. °Left message on voicemail with name and callback number °Aggie Douse RN Navigator Cardiac Imaging °[facility] °[phone] Office °[phone] Cell ° °

## 2020-07-16 NOTE — Telephone Encounter (Signed)
Reaching out to patient to offer assistance regarding upcoming cardiac imaging study; pt verbalizes understanding of appt date/time, parking situation and where to check in, pre-test NPO status and medications ordered, and verified current allergies; name and call back number provided for further questions should they arise Marchia Bond RN Navigator Cardiac Imaging [facility] [phone] office [phone] cell   Holding cialis x 48 hr and taking 100mg  metoprolol tartrate  Clarise Cruz

## 2020-07-18 ENCOUNTER — Other Ambulatory Visit: Payer: Self-pay

## 2020-07-18 ENCOUNTER — Ambulatory Visit (HOSPITAL_COMMUNITY)
Admission: RE | Admit: 2020-07-18 | Discharge: 2020-07-18 | Disposition: A | Discharge status: Home / Self Care | Payer: BC Managed Care – PPO | Source: Ambulatory Visit | Attending: Cardiology | Admitting: Cardiology

## 2020-07-18 DIAGNOSIS — R079 Chest pain, unspecified: Secondary | ICD-10-CM

## 2020-07-18 MED ORDER — SODIUM CHLORIDE 0.9 % IV BOLUS: 500.0000 mL | Freq: Once | INTRAVENOUS | Status: DC

## 2020-07-18 MED ORDER — SODIUM CHLORIDE 0.9 % IV BOLUS
500.0000 mL | Freq: Once | INTRAVENOUS | Status: AC
  Administered 2020-07-18: 500 mL via INTRAVENOUS

## 2020-07-18 MED ORDER — NITROGLYCERIN 0.4 MG SL SUBL: 0.8000 mg | SUBLINGUAL_TABLET | Freq: Once | SUBLINGUAL | Status: AC

## 2020-07-18 MED ORDER — IOHEXOL 350 MG/ML SOLN
80.0000 mL | Freq: Once | INTRAVENOUS | Status: AC | PRN
  Administered 2020-07-18: 80 mL via INTRAVENOUS

## 2020-07-18 MED ORDER — NITROGLYCERIN 0.4 MG SL SUBL
SUBLINGUAL_TABLET | SUBLINGUAL | Status: AC
  Administered 2020-07-18: 0.8 mg via SUBLINGUAL
  Filled 2020-07-18: qty 2

## 2020-07-18 NOTE — Progress Notes (Incomplete)
   07/18/20 0824  Vital Signs  Pulse Rate (!) 56  BP (!) 77/54  MEWS Score  MEWS Temp 0  MEWS Systolic 2  MEWS Pulse 0  MEWS RR 0  MEWS LOC 0  MEWS Score 2  MEWS Score Color Yellow   Blood pressure rechecked after snack given and caffeine given. Patient states he feels dizzy. Dr. Agustin Cree informed and order given for 56mL NS bolus. Will reassess  0830- After bolus completed, patient BP: 87/63, HR 49. Patient states he feels slightly better sitting in chair but felt dizzy after standing. Dr. Agustin Cree informed of BP post bolus and additional order for 524mL NS bolus given. Will reassess.

## 2020-07-25 DIAGNOSIS — D126 Benign neoplasm of colon, unspecified: Secondary | ICD-10-CM | POA: Diagnosis not present

## 2020-07-25 DIAGNOSIS — K7581 Nonalcoholic steatohepatitis (NASH): Secondary | ICD-10-CM | POA: Diagnosis not present

## 2020-07-25 DIAGNOSIS — Z0000 Encounter for general adult medical examination without abnormal findings: Secondary | ICD-10-CM | POA: Diagnosis not present

## 2020-07-25 DIAGNOSIS — R079 Chest pain, unspecified: Secondary | ICD-10-CM | POA: Insufficient documentation

## 2020-07-25 DIAGNOSIS — R2689 Other abnormalities of gait and mobility: Secondary | ICD-10-CM | POA: Diagnosis not present

## 2020-07-25 DIAGNOSIS — M5459 Other low back pain: Secondary | ICD-10-CM | POA: Diagnosis not present

## 2020-07-25 DIAGNOSIS — G8929 Other chronic pain: Secondary | ICD-10-CM | POA: Diagnosis not present

## 2020-07-25 DIAGNOSIS — I10 Essential (primary) hypertension: Secondary | ICD-10-CM | POA: Diagnosis not present

## 2020-07-25 DIAGNOSIS — K219 Gastro-esophageal reflux disease without esophagitis: Secondary | ICD-10-CM | POA: Diagnosis not present

## 2020-08-10 DIAGNOSIS — H40023 Open angle with borderline findings, high risk, bilateral: Secondary | ICD-10-CM | POA: Diagnosis not present

## 2020-11-07 DIAGNOSIS — D225 Melanocytic nevi of trunk: Secondary | ICD-10-CM | POA: Diagnosis not present

## 2020-11-07 DIAGNOSIS — L812 Freckles: Secondary | ICD-10-CM | POA: Diagnosis not present

## 2020-11-07 DIAGNOSIS — D1801 Hemangioma of skin and subcutaneous tissue: Secondary | ICD-10-CM | POA: Diagnosis not present

## 2020-11-07 DIAGNOSIS — L821 Other seborrheic keratosis: Secondary | ICD-10-CM | POA: Diagnosis not present

## 2020-11-07 DIAGNOSIS — L570 Actinic keratosis: Secondary | ICD-10-CM | POA: Diagnosis not present

## 2020-12-21 DIAGNOSIS — Z23 Encounter for immunization: Secondary | ICD-10-CM | POA: Diagnosis not present

## 2021-02-05 DIAGNOSIS — E119 Type 2 diabetes mellitus without complications: Secondary | ICD-10-CM | POA: Diagnosis not present

## 2021-02-05 DIAGNOSIS — K7581 Nonalcoholic steatohepatitis (NASH): Secondary | ICD-10-CM | POA: Diagnosis not present

## 2021-02-05 DIAGNOSIS — K219 Gastro-esophageal reflux disease without esophagitis: Secondary | ICD-10-CM | POA: Diagnosis not present

## 2021-02-05 DIAGNOSIS — I10 Essential (primary) hypertension: Secondary | ICD-10-CM | POA: Diagnosis not present

## 2021-05-08 DIAGNOSIS — H40003 Preglaucoma, unspecified, bilateral: Secondary | ICD-10-CM | POA: Diagnosis not present

## 2021-05-27 DIAGNOSIS — H9193 Unspecified hearing loss, bilateral: Secondary | ICD-10-CM | POA: Diagnosis not present

## 2021-05-27 DIAGNOSIS — Z8669 Personal history of other diseases of the nervous system and sense organs: Secondary | ICD-10-CM | POA: Diagnosis not present

## 2021-05-27 DIAGNOSIS — H6121 Impacted cerumen, right ear: Secondary | ICD-10-CM | POA: Diagnosis not present

## 2021-06-12 DIAGNOSIS — K5730 Diverticulosis of large intestine without perforation or abscess without bleeding: Secondary | ICD-10-CM

## 2021-06-12 HISTORY — DX: Diverticulosis of large intestine without perforation or abscess without bleeding: K57.30

## 2021-06-15 ENCOUNTER — Emergency Department (HOSPITAL_COMMUNITY): Payer: BC Managed Care – PPO

## 2021-06-15 ENCOUNTER — Encounter (HOSPITAL_COMMUNITY): Payer: Self-pay | Admitting: Emergency Medicine

## 2021-06-15 ENCOUNTER — Emergency Department (HOSPITAL_COMMUNITY)
Admission: EM | Admit: 2021-06-15 | Discharge: 2021-06-15 | Disposition: A | Discharge status: Home / Self Care | Payer: BC Managed Care – PPO | Attending: Emergency Medicine | Admitting: Emergency Medicine

## 2021-06-15 ENCOUNTER — Other Ambulatory Visit: Payer: Self-pay

## 2021-06-15 DIAGNOSIS — R1013 Epigastric pain: Secondary | ICD-10-CM | POA: Insufficient documentation

## 2021-06-15 DIAGNOSIS — R1033 Periumbilical pain: Secondary | ICD-10-CM | POA: Diagnosis not present

## 2021-06-15 DIAGNOSIS — E119 Type 2 diabetes mellitus without complications: Secondary | ICD-10-CM | POA: Diagnosis not present

## 2021-06-15 DIAGNOSIS — R1011 Right upper quadrant pain: Secondary | ICD-10-CM | POA: Diagnosis not present

## 2021-06-15 DIAGNOSIS — K760 Fatty (change of) liver, not elsewhere classified: Secondary | ICD-10-CM

## 2021-06-15 DIAGNOSIS — R000 Tachycardia, unspecified: Secondary | ICD-10-CM | POA: Insufficient documentation

## 2021-06-15 DIAGNOSIS — Z7984 Long term (current) use of oral hypoglycemic drugs: Secondary | ICD-10-CM | POA: Insufficient documentation

## 2021-06-15 DIAGNOSIS — Z20822 Contact with and (suspected) exposure to covid-19: Secondary | ICD-10-CM | POA: Diagnosis not present

## 2021-06-15 DIAGNOSIS — Z79899 Other long term (current) drug therapy: Secondary | ICD-10-CM | POA: Diagnosis not present

## 2021-06-15 DIAGNOSIS — R109 Unspecified abdominal pain: Secondary | ICD-10-CM

## 2021-06-15 DIAGNOSIS — I10 Essential (primary) hypertension: Secondary | ICD-10-CM | POA: Diagnosis not present

## 2021-06-15 LAB — CBC
HCT: 45.5 % (ref 39.0–52.0)
Hemoglobin: 15.6 g/dL (ref 13.0–17.0)
MCH: 29.5 pg (ref 26.0–34.0)
MCHC: 34.3 g/dL (ref 30.0–36.0)
MCV: 86 fL (ref 80.0–100.0)
Platelets: 199 10*3/uL (ref 150–400)
RBC: 5.29 MIL/uL (ref 4.22–5.81)
RDW: 12.8 % (ref 11.5–15.5)
WBC: 6 10*3/uL (ref 4.0–10.5)
nRBC: 0 % (ref 0.0–0.2)

## 2021-06-15 LAB — URINALYSIS, ROUTINE W REFLEX MICROSCOPIC
Bilirubin Urine: NEGATIVE
Glucose, UA: NEGATIVE mg/dL
Hgb urine dipstick: NEGATIVE
Ketones, ur: 5 mg/dL — AB
Leukocytes,Ua: NEGATIVE
Nitrite: NEGATIVE
Protein, ur: NEGATIVE mg/dL
Specific Gravity, Urine: 1.024 (ref 1.005–1.030)
pH: 5 (ref 5.0–8.0)

## 2021-06-15 LAB — COMPREHENSIVE METABOLIC PANEL
ALT: 51 U/L — ABNORMAL HIGH (ref 0–44)
AST: 34 U/L (ref 15–41)
Albumin: 4.4 g/dL (ref 3.5–5.0)
Alkaline Phosphatase: 48 U/L (ref 38–126)
Anion gap: 12 (ref 5–15)
BUN: 22 mg/dL — ABNORMAL HIGH (ref 6–20)
CO2: 21 mmol/L — ABNORMAL LOW (ref 22–32)
Calcium: 9.4 mg/dL (ref 8.9–10.3)
Chloride: 101 mmol/L (ref 98–111)
Creatinine, Ser: 1.11 mg/dL (ref 0.61–1.24)
GFR, Estimated: >60 mL/min (ref >60)
Glucose, Bld: 168 mg/dL — ABNORMAL HIGH (ref 70–99)
Potassium: 4 mmol/L (ref 3.5–5.1)
Sodium: 134 mmol/L — ABNORMAL LOW (ref 135–145)
Total Bilirubin: 0.9 mg/dL (ref 0.3–1.2)
Total Protein: 7.5 g/dL (ref 6.5–8.1)

## 2021-06-15 LAB — LIPASE, BLOOD: Lipase: 36 U/L (ref 11–51)

## 2021-06-15 LAB — RESP PANEL BY RT-PCR (FLU A&B, COVID) ARPGX2
Influenza A by PCR: NEGATIVE
Influenza B by PCR: NEGATIVE
SARS Coronavirus 2 by RT PCR: NEGATIVE

## 2021-06-15 MED ORDER — OXYCODONE HCL 5 MG PO TABS: 5.0000 mg | ORAL_TABLET | ORAL | 0 refills | Status: DC | PRN

## 2021-06-15 MED ORDER — SENNOSIDES-DOCUSATE SODIUM 8.6-50 MG PO TABS: 1.0000 | ORAL_TABLET | Freq: Every day | ORAL | 0 refills | Status: DC | PRN

## 2021-06-15 MED ORDER — DICYCLOMINE HCL 20 MG PO TABS: 20.0000 mg | ORAL_TABLET | Freq: Two times a day (BID) | ORAL | 0 refills | Status: DC | PRN

## 2021-06-15 MED ORDER — OXYCODONE-ACETAMINOPHEN 5-325 MG PO TABS
1.0000 | ORAL_TABLET | Freq: Once | ORAL | Status: AC
  Administered 2021-06-15: 1 via ORAL
  Filled 2021-06-15: qty 1

## 2021-06-15 MED ORDER — ONDANSETRON 4 MG PO TBDP
4.0000 mg | ORAL_TABLET | Freq: Once | ORAL | Status: AC
  Administered 2021-06-15: 4 mg via ORAL
  Filled 2021-06-15: qty 1

## 2021-06-15 MED ORDER — DICYCLOMINE HCL 10 MG PO CAPS
20.0000 mg | ORAL_CAPSULE | Freq: Once | ORAL | Status: AC
  Administered 2021-06-15: 20 mg via ORAL
  Filled 2021-06-15: qty 2

## 2021-06-15 MED ORDER — IOHEXOL 300 MG/ML  SOLN
75.0000 mL | Freq: Once | INTRAMUSCULAR | Status: AC | PRN
  Administered 2021-06-15: 75 mL via INTRAVENOUS

## 2021-06-15 MED ORDER — SODIUM CHLORIDE 0.9 % IV BOLUS
1000.0000 mL | Freq: Once | INTRAVENOUS | Status: AC
  Administered 2021-06-15: 1000 mL via INTRAVENOUS

## 2021-06-15 MED ORDER — ONDANSETRON 4 MG PO TBDP: 4.0000 mg | ORAL_TABLET | Freq: Three times a day (TID) | ORAL | 0 refills | Status: DC | PRN

## 2021-06-15 NOTE — ED Provider Notes (Signed)
[facility] EMERGENCY DEPARTMENT Provider Note   CSN: [id number] Arrival date & time: 06/15/21  1139     History  Chief Complaint  Patient presents with   Abdominal Pain    Kyle Schneider is a 56 y.o. male. With past medical history of HTN, HLD, GERD, T2DM, IBS who presents to the emergency department with abdominal pain.  States that last night he and his wife went out to dinner to [facility].  He states that he ate barbecue as well as hash puppies and had 3-4 beers.  He states that he had abdominal pain around midnight last night that was mild.  States that he had some indigestion.  States that he went to sleep thinking that he ate too much.  He states that this morning he woke up with worsening periumbilical abdominal pain, nausea and dry heaving.  Feels bloated.  Pain is nonradiating.  Continues to tolerate liquids at home.  He denies known fever since yesterday.  Denies urinary symptoms, diarrhea, previous abdominal surgeries.  Last bowel movement yesterday, continues to pass flatus. Endorses ibuprofen ~'600mg'$  3x/week, as well as around 10 beers a week.    Abdominal Pain Associated symptoms: nausea   Associated symptoms: no diarrhea, no dysuria, no fever and no vomiting       Home Medications Prior to Admission medications   Medication Sig Start Date End Date Taking? Authorizing Provider  amLODipine (NORVASC) 10 MG tablet Take 5 mg by mouth daily.    [provider]  bimatoprost (LUMIGAN) 0.01 % SOLN Place 1 drop into both eyes daily.    [provider]  esomeprazole (NEXIUM) 20 MG packet Take 20 mg by mouth daily.    [provider]  famotidine (PEPCID) 40 MG tablet Take 40 mg by mouth at bedtime. 01/11/20   [provider]  metFORMIN (GLUCOPHAGE) 500 MG tablet Take 500 mg by mouth daily.    [provider]  metoprolol tartrate (LOPRESSOR) 100 MG tablet Take 2 hours before ct 06/06/20   Park Liter, MD  olmesartan  (BENICAR) 20 MG tablet Take 20 mg by mouth daily. 05/30/16   [provider]  rosuvastatin (CRESTOR) 10 MG tablet Take 10 mg by mouth at bedtime. 05/17/20   [provider]  tadalafil (CIALIS) 5 MG tablet Take 2.5 mg by mouth 2 (two) times daily. 02/28/16   [provider]  TRAVATAN Z 0.004 % SOLN ophthalmic solution instill 1 drop in both eyes every day at bedtime 01/13/17   [provider]      Allergies    Patient has no known allergies.    Review of Systems   Review of Systems  Constitutional:  Negative for fever.  Gastrointestinal:  Positive for abdominal pain and nausea. Negative for diarrhea and vomiting.  Genitourinary:  Negative for dysuria.  All other systems reviewed and are negative.  Physical Exam Updated Vital Signs BP (!) 135/91 (BP Location: Right Arm)    Pulse (!) 117    Temp 99.6 F (37.6 C) (Oral)    Resp 17    Ht '5\' 8"'$  (1.727 m)    Wt 78.5 kg    SpO2 96%    BMI 26.30 kg/m  Physical Exam Vitals and nursing note reviewed.  Constitutional:      General: He is not in acute distress.    Appearance: Normal appearance. He is well-developed. He is not ill-appearing or toxic-appearing.  HENT:     Head: Normocephalic and  atraumatic.     Mouth/Throat:     Mouth: Mucous membranes are moist.     Pharynx: Oropharynx is clear.  Eyes:     General: No scleral icterus.    Extraocular Movements: Extraocular movements intact.     Pupils: Pupils are equal, round, and reactive to light.  Cardiovascular:     Rate and Rhythm: Regular rhythm. Tachycardia present.     Heart sounds: Normal heart sounds. No murmur heard. Pulmonary:     Effort: Pulmonary effort is normal. No respiratory distress.     Breath sounds: Normal breath sounds.  Abdominal:     General: Abdomen is protuberant. Bowel sounds are normal.     Palpations: Abdomen is soft.     Tenderness: There is abdominal tenderness in the epigastric area and periumbilical area. There is no right  CVA tenderness, left CVA tenderness, guarding or rebound. Negative signs include Rovsing's sign.     Hernia: No hernia is present.  Skin:    General: Skin is warm and dry.     Capillary Refill: Capillary refill takes less than 2 seconds.  Neurological:     General: No focal deficit present.     Mental Status: He is alert and oriented to person, place, and time.  Psychiatric:        Mood and Affect: Mood normal.        Behavior: Behavior normal.    ED Results / Procedures / Treatments   Labs (all labs ordered are listed, but only abnormal results are displayed) Labs Reviewed  COMPREHENSIVE METABOLIC PANEL - Abnormal; Notable for the following components:      Result Value   Sodium 134 (*)    CO2 21 (*)    Glucose, Bld 168 (*)    BUN 22 (*)    ALT 51 (*)    All other components within normal limits  URINALYSIS, ROUTINE W REFLEX MICROSCOPIC - Abnormal; Notable for the following components:   Ketones, ur 5 (*)    All other components within normal limits  LIPASE, BLOOD  CBC   EKG None  Radiology No results found. [place] RUQ: IMPRESSION: 1. No cholelithiasis or sonographic evidence of acute cholecystitis. 2. Increased hepatic echogenicity as can be seen with hepatic steatosis.   Procedures Procedures   Medications Ordered in ED Medications  ondansetron (ZOFRAN-ODT) disintegrating tablet 4 mg (has no administration in time range)  sodium chloride 0.9 % bolus 1,000 mL (has no administration in time range)    ED Course/ Medical Decision Making/ A&P Clinical Course as of 06/15/21 1534  Sat Jun 15, 2021  1506 Abd pain, [place] with steatosis. FU on CT and dispo [BH]    Clinical Course User Index [BH] Henderly, Britni A, PA-C                           Medical Decision Making Amount and/or Complexity of Data Reviewed Labs: ordered. Radiology: ordered.  Risk Prescription drug management.  This patient presents to the ED for concern of abdominal pain, this involves an  extensive number of treatment options, and is a complaint that carries with it a high risk of complications and morbidity.  The differential diagnosis includes hepatobiliary disease, pancreatitis, PUD, acute infectious process, acute appendicitis, vascular catastrophe, bowel obstruction, viscus perforation, torsion, diverticulitis, etc.   Co morbidities that complicate the patient evaluation GERD, T2DM, IBS  Additional history obtained:  Additional history obtained from: wife at bedside  External  records from outside source obtained and reviewed including: previous internal medicine visits   Lab Results: I personally ordered, reviewed, and interpreted labs. Pertinent results include: CBC within normal limits CMP without gross electrolyte derangement Lipase 36, negative UA negative  Imaging Studies ordered:  I ordered imaging studies which included ultrasound and CT.  I independently reviewed & interpreted imaging & am in agreement with radiology impression. Imaging shows: Ultrasound right upper quadrant: Negative CT abdomen pelvis with contrast pending  Medications  I ordered medication including Zofran and IVF for nausea.  Percocet for pain Reevaluation of the patient after medication shows that patient improved  ED Course: This 56 year old male who presents emergency department with abdominal pain.  He is well-appearing.  There is no evidence of an acute abdomen.  He does have tenderness to palpation at the periumbilical region as well as mild epigastric and right upper quadrant tenderness.  Lipase 36, negative, doubt acute pancreatitis at this time No transaminitis, elevated bilirubin, right upper quadrant ultrasound without stones.  I doubt acute cholecystitis, cholangitis, choledocholithiasis at this time. No urinary symptoms, UA negative, doubt acute cystitis or pyelonephritis, stone. Symptoms are not consistent with bowel obstruction  At time of handoff to [facility], PA-C  patient is pending CT abdomen pelvis with contrast.  It may be possible that he has atypical acute appendicitis given that he has periumbilical tenderness to palpation.  Disposition will be pending imaging.  If imaging is normal can proceed with PUD treatment, Zofran for home with return precautions.  Ultimate disposition pending imaging and completed work-up.  Please see Henderly, PA-C note for completion of patient care  Final Clinical Impression(s) / ED Diagnoses Final diagnoses:  Abdominal pain    Rx / DC Orders ED Discharge Orders     None         Mickie Hillier, PA-C 06/15/21 1539    Valarie Merino, MD 06/16/21 [phone]

## 2021-06-15 NOTE — ED Provider Notes (Signed)
Attending Dr. Langston Masker discussed labs/ imaging with patient as well as disposition ?  ?Bracen Schum A, PA-C ?06/15/21 1730 ? ?  ?Wyvonnia Dusky, MD ?06/15/21 1818 ? ?

## 2021-06-15 NOTE — ED Triage Notes (Signed)
Pt is a physician.  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  States it started about 2 hours after eating large BBQ meal and drinking beer.  Denies vomiting/diarrhea.   ?

## 2021-06-15 NOTE — ED Notes (Signed)
Pt's temp is 102.8 before departure. MD Trifan made aware. Was told to instruct the pt to take some tylenol at home, also swabbed pt for Covid to rule out Covid.  ?

## 2021-06-15 NOTE — ED Notes (Signed)
Patient transported to CT 

## 2021-06-15 NOTE — ED Provider Triage Note (Signed)
Emergency Medicine Provider Triage Evaluation Note ? ?Kyle Schneider , a 56 y.o. male  was evaluated in triage.  Pt with a past medical history of NAFLD, type 2 diabetes and hypertension here with a complaint of of epigastric and right upper quadrant pain.  Patient reports that last night he was drinking alcohol and eating some barbecue.  Last night he began to feel slightly diaphoretic, nauseated, no vomiting, body aches and severe abdominal pain. ? ?No history of abdominal surgery. ? ?Review of Systems  ?Positive: Chills, diaphoresis, abdominal pain ?Negative: Vomiting, diarrhea or constipation ? ?Physical Exam  ?BP (!) 135/91 (BP Location: Right Arm)   Pulse (!) 117   Temp 99.6 ?F (37.6 ?C) (Oral)   Resp 17   Ht '5\' 8"'$  (1.727 m)   Wt 78.5 kg   SpO2 96%   BMI 26.30 kg/m?  ?Gen:   Awake, no distress   ?Resp:  Normal effort  ?MSK:   Moves extremities without difficulty  ?Other:  Severe right upper quadrant pain.  Positive Murphy's.  Negative McBurney's.  Slight tenderness to the epigastrium and right lower quadrant.  Suspect gallbladder etiology ? ?Medical Decision Making  ?Medically screening exam initiated at 12:20 PM.  Appropriate orders placed.  Kyle Schneider was informed that the remainder of the evaluation will be completed by another provider, this initial triage assessment does not replace that evaluation, and the importance of remaining in the ED until their evaluation is complete. ? ? ? ?COVID test ordered due to URI symptoms and COVID test and RUQ ultrasound ordered at this time.  Potential cholecystitis/cholangitis ?  ?Steffani Dionisio A, PA-C ?06/15/21 1224 ? ?

## 2021-07-07 ENCOUNTER — Ambulatory Visit (INDEPENDENT_AMBULATORY_CARE_PROVIDER_SITE_OTHER): Payer: BC Managed Care – PPO

## 2021-07-07 ENCOUNTER — Other Ambulatory Visit: Payer: Self-pay

## 2021-07-07 ENCOUNTER — Encounter (HOSPITAL_COMMUNITY): Payer: Self-pay

## 2021-07-07 ENCOUNTER — Ambulatory Visit (HOSPITAL_COMMUNITY)
Admission: EM | Admit: 2021-07-07 | Discharge: 2021-07-07 | Disposition: A | Discharge status: Home / Self Care | Payer: BC Managed Care – PPO | Attending: Nurse Practitioner | Admitting: Nurse Practitioner

## 2021-07-07 DIAGNOSIS — M542 Cervicalgia: Secondary | ICD-10-CM

## 2021-07-07 DIAGNOSIS — S199XXA Unspecified injury of neck, initial encounter: Secondary | ICD-10-CM | POA: Diagnosis not present

## 2021-07-07 NOTE — ED Provider Notes (Signed)
?[facility] ? ? ? ?CSN: [id number] ?Arrival date & time: 07/07/21  1041 ? ? ?  ? ?History   ?Chief Complaint ?Chief Complaint  ?Patient presents with  ? Head Injury  ? ? 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  ? ?Patient reports 4 days ago, while hiking in the [place], he was sitting on a [place] wall and sit to stand up, hitting the top of his head on a large [place].  He reports he scraped the top of his head.  Denies any immediate loss of consciousness, neck pain, vision changes, headache, nausea, vomiting, or dizziness.  He denies any current headache, dizziness, vision changes, nausea, or vomiting.  He denies any numbness or tingling in his fingers.  He does have pain in his neck when he looks left and right drastically.  He has been cleaning the wound on the top of his head and been taking ibuprofen which helps with the pain.  He is requesting imaging today.  ? ? ?Past Medical History:  ?Diagnosis Date  ? Allergic rhinitis 08/15/2015  ? Anxiety disorder 08/15/2015  ? Arthritis   ? Atopic dermatitis 06/29/2019  ? Atypical chest pain 02/11/2017  ? Benign neoplasm of colon 08/15/2015  ? Formatting of this note might be different from the original. [place].  ? Benign prostatic hyperplasia with urinary frequency 08/15/2015  ? Bilateral hearing loss 05/18/2017  ? Borderline diabetes 02/11/2017  ? BPH (benign prostatic hyperplasia)   ? DDD (degenerative disc disease), lumbosacral 08/15/2015  ? Dyslipidemia 02/11/2017  ? Essential hypertension 02/11/2017  ? ETD (Eustachian tube dysfunction), bilateral 05/18/2017  ? GERD without esophagitis 08/15/2015  ? Glaucoma 08/15/2015  ? Hemorrhoids, internal, with bleeding 06/18/2016  ? High risk medication use 07/24/2016  ? Hydrocele of testis 08/15/2015  ? Hyperlipidemia   ? Hypertension   ? IBS (irritable bowel syndrome) 08/15/2015  ? Intrinsic sphincter deficiency 08/15/2015  ? Nonalcoholic fatty liver disease without nonalcoholic steatohepatitis (NASH) 06/29/2019  ? Formatting of this  note might be different from the original. Abnormal LFTs. [place] with Fatty liver.  ? Overweight 08/15/2015  ? Palpitations 08/15/2015  ? Scoliosis   ? Type 2 diabetes mellitus without complication, without long-term current use of insulin ([place]) 05/30/2016  ? Formatting of this note might be different from the original. A1C=6.5 on 05/2017  ? Unequal leg length 08/15/2015  ? Varicose veins of leg with pain left leg  ? ? ?Patient Active Problem List  ? Diagnosis Date Noted  ? Varicose veins of leg with pain   ? Scoliosis   ? Hypertension   ? Hyperlipidemia   ? BPH (benign prostatic hyperplasia)   ? Arthritis   ? Atopic dermatitis 06/29/2019  ? Nonalcoholic fatty liver disease without nonalcoholic steatohepatitis (NASH) 06/29/2019  ? Bilateral hearing loss 05/18/2017  ? ETD (Eustachian tube dysfunction), bilateral 05/18/2017  ? Atypical chest pain 02/11/2017  ? Borderline diabetes 02/11/2017  ? Dyslipidemia 02/11/2017  ? Essential hypertension 02/11/2017  ? High risk medication use 07/24/2016  ? Hemorrhoids, internal, with bleeding 06/18/2016  ? Type 2 diabetes mellitus without complication, without long-term current use of insulin ([place]) 05/30/2016  ? Allergic rhinitis 08/15/2015  ? Anxiety disorder 08/15/2015  ? Benign neoplasm of colon 08/15/2015  ? Benign prostatic hyperplasia with urinary frequency 08/15/2015  ? DDD (degenerative disc disease), lumbosacral 08/15/2015  ? GERD without esophagitis 08/15/2015  ? Glaucoma 08/15/2015  ? Hydrocele of testis 08/15/2015  ? IBS (irritable bowel syndrome) 08/15/2015  ?  Intrinsic sphincter deficiency 08/15/2015  ? Overweight 08/15/2015  ? Palpitations 08/15/2015  ? Unequal leg length 08/15/2015  ? ? ?Past Surgical History:  ?Procedure Laterality Date  ? ENDOVENOUS ABLATION SAPHENOUS VEIN W/ LASER  12-04-2010 Left Greater SaphenousVein   ? STAB PHLEBECTOMY  12-04-2010 LEFT LEG 10-20 INCISIONS  ? VASECTOMY    ? VASECTOMY    ? ? ? ? ? ?Home Medications   ? ?Prior to Admission medications    ?Medication Sig Start Date End Date Taking? Authorizing Provider  ?amLODipine (NORVASC) 5 MG tablet Take 5 mg by mouth daily. 04/17/21   [provider]  ?bimatoprost (LUMIGAN) 0.01 % SOLN Place 1 drop into both eyes at bedtime.    [provider]  ?dicyclomine (BENTYL) 20 MG tablet Take 1 tablet (20 mg total) by mouth 2 (two) times daily as needed for up to 20 doses for spasms. Abdominal cramping 06/15/21   Wyvonnia Dusky, MD  ?esomeprazole (NEXIUM) 40 MG capsule Take 40 mg by mouth daily.    [provider]  ?famotidine (PEPCID) 40 MG tablet Take 40 mg by mouth at bedtime. 01/11/20   [provider]  ?metFORMIN (GLUCOPHAGE-XR) 500 MG 24 hr tablet Take 500 mg by mouth at bedtime. 06/10/21   [provider]  ?olmesartan (BENICAR) 20 MG tablet Take 20 mg by mouth daily. 05/30/16   [provider]  ?ondansetron (ZOFRAN-ODT) 4 MG disintegrating tablet Take 1 tablet (4 mg total) by mouth every 8 (eight) hours as needed for up to 15 doses for nausea or vomiting. 06/15/21   Wyvonnia Dusky, MD  ?oxyCODONE (ROXICODONE) 5 MG immediate release tablet Take 1 tablet (5 mg total) by mouth every 4 (four) hours as needed for up to 10 doses for severe pain. 06/15/21   Wyvonnia Dusky, MD  ?polycarbophil (FIBERCON) 625 MG tablet Take 1,250 mg by mouth every morning.    [provider]  ?rosuvastatin (CRESTOR) 10 MG tablet Take 10 mg by mouth at bedtime. 05/17/20   [provider]  ?scopolamine (TRANSDERM-SCOP) 1 MG/3DAYS Place 1 patch onto the skin every 3 (three) days. 06/03/21   [provider]  ?senna-docusate (SENOKOT-S) 8.6-50 MG tablet Take 1 tablet by mouth daily as needed for up to 20 doses for mild constipation. 06/15/21   Wyvonnia Dusky, MD  ?Tadalafil 2.5 MG TABS Take 2.5 mg by mouth 2 (two) times daily. 02/05/21   [provider]  ? ? ?Family History ?Family History  ?Problem Relation Age of Onset  ? Alzheimer's disease Mother   ? CAD  Father   ? Lymphoma Sister   ? Heart disease Paternal Grandfather   ? ? ?Social History ?Social History  ? ?Tobacco Use  ? Smoking status: Never  ? Smokeless tobacco: Never  ?Vaping Use  ? Vaping Use: Never used  ?Substance Use Topics  ? Alcohol use: Yes  ?  Comment: 3-5 glasses of wine/beer weekly  ? Drug use: No  ? ? ? ?Allergies   ?Patient has no known allergies. ? ? ?Review of Systems ?Review of Systems ?Per HPI ? ?Physical Exam ?Triage Vital Signs ?ED Triage Vitals  ?Enc Vitals Group  ?   BP 07/07/21 1215 (!) 143/100  ?   Pulse Rate 07/07/21 1215 97  ?   Resp 07/07/21 1215 16  ?   Temp 07/07/21 1215 98.3 ?F (36.8 ?C)  ?   Temp Source 07/07/21 1215 Oral  ?   SpO2 07/07/21 1215 100 %  ?  Weight --   ?   Height --   ?   Head Circumference --   ?   Peak Flow --   ?   Pain Score 07/07/21 1216 5  ?   Pain Loc --   ?   Pain Edu? --   ?   Excl. in [place]? --   ? ?No data found. ? ?Updated Vital Signs ?BP (!) 143/100 (BP Location: Left Arm)   Pulse 97   Temp 98.3 ?F (36.8 ?C) (Oral)   Resp 16   SpO2 100%  ? ?Visual Acuity ?Right Eye Distance:   ?Left Eye Distance:   ?Bilateral Distance:   ? ?Right Eye Near:   ?Left Eye Near:    ?Bilateral Near:    ? ?Physical Exam ?Vitals and nursing note reviewed.  ?Constitutional:   ?   General: He is not in acute distress. ?   Appearance: Normal appearance. He is not toxic-appearing.  ?HENT:  ?   Head:  ? ?   Comments: Scabbed over abrasion top of scalp; no surrounding erythema, bruising, or swelling.  No drainage ?Eyes:  ?   General: No scleral icterus.    ?   Right eye: No discharge.     ?   Left eye: No discharge.  ?   Extraocular Movements: Extraocular movements intact.  ?   Right eye: Normal extraocular motion and no nystagmus.  ?   Left eye: Normal extraocular motion and no nystagmus.  ?   Pupils: Pupils are equal, round, and reactive to light.  ?Neck:  ?   Comments: No erythema, swelling to posterior neck  ?Pulmonary:  ?   Effort: Pulmonary effort is normal. No respiratory  distress.  ?Musculoskeletal:  ?   Cervical back: Full passive range of motion without pain, normal range of motion and neck supple. No crepitus. No pain with movement, spinous process tenderness or muscular tend

## 2021-07-07 NOTE — ED Triage Notes (Signed)
[place] He [place] and hitting his head. Pt reports neck pain that started a couple of days.  ?

## 2021-07-07 NOTE — Discharge Instructions (Addendum)
-   The x-ray today does not show an acute fracture of your neck bones ?- Continue wound care to your scalp ?- Can continue ibuprofen for pain  ?

## 2021-07-31 DIAGNOSIS — H6983 Other specified disorders of Eustachian tube, bilateral: Secondary | ICD-10-CM | POA: Diagnosis not present

## 2021-08-20 DIAGNOSIS — Z0000 Encounter for general adult medical examination without abnormal findings: Secondary | ICD-10-CM | POA: Diagnosis not present

## 2021-08-20 DIAGNOSIS — E782 Mixed hyperlipidemia: Secondary | ICD-10-CM | POA: Diagnosis not present

## 2021-08-20 DIAGNOSIS — F419 Anxiety disorder, unspecified: Secondary | ICD-10-CM | POA: Diagnosis not present

## 2021-08-20 DIAGNOSIS — D649 Anemia, unspecified: Secondary | ICD-10-CM | POA: Diagnosis not present

## 2021-08-20 DIAGNOSIS — E538 Deficiency of other specified B group vitamins: Secondary | ICD-10-CM | POA: Diagnosis not present

## 2021-08-20 DIAGNOSIS — N401 Enlarged prostate with lower urinary tract symptoms: Secondary | ICD-10-CM | POA: Diagnosis not present

## 2021-08-20 DIAGNOSIS — M5137 Other intervertebral disc degeneration, lumbosacral region: Secondary | ICD-10-CM | POA: Diagnosis not present

## 2021-08-20 DIAGNOSIS — E119 Type 2 diabetes mellitus without complications: Secondary | ICD-10-CM | POA: Diagnosis not present

## 2021-10-02 DIAGNOSIS — L812 Freckles: Secondary | ICD-10-CM | POA: Diagnosis not present

## 2021-10-02 DIAGNOSIS — D225 Melanocytic nevi of trunk: Secondary | ICD-10-CM | POA: Diagnosis not present

## 2021-10-02 DIAGNOSIS — L821 Other seborrheic keratosis: Secondary | ICD-10-CM | POA: Diagnosis not present

## 2021-10-02 DIAGNOSIS — I788 Other diseases of capillaries: Secondary | ICD-10-CM | POA: Diagnosis not present

## 2021-10-02 DIAGNOSIS — Z23 Encounter for immunization: Secondary | ICD-10-CM | POA: Diagnosis not present

## 2022-01-03 DIAGNOSIS — Z23 Encounter for immunization: Secondary | ICD-10-CM | POA: Diagnosis not present

## 2022-01-08 DIAGNOSIS — K760 Fatty (change of) liver, not elsewhere classified: Secondary | ICD-10-CM | POA: Diagnosis not present

## 2022-01-08 DIAGNOSIS — R350 Frequency of micturition: Secondary | ICD-10-CM | POA: Diagnosis not present

## 2022-01-08 DIAGNOSIS — N401 Enlarged prostate with lower urinary tract symptoms: Secondary | ICD-10-CM | POA: Diagnosis not present

## 2022-01-08 DIAGNOSIS — I10 Essential (primary) hypertension: Secondary | ICD-10-CM | POA: Diagnosis not present

## 2022-02-19 DIAGNOSIS — S99911A Unspecified injury of right ankle, initial encounter: Secondary | ICD-10-CM | POA: Diagnosis not present

## 2022-02-26 DIAGNOSIS — S93401A Sprain of unspecified ligament of right ankle, initial encounter: Secondary | ICD-10-CM | POA: Diagnosis not present

## 2022-02-26 DIAGNOSIS — M25571 Pain in right ankle and joints of right foot: Secondary | ICD-10-CM | POA: Diagnosis not present

## 2022-04-08 ENCOUNTER — Emergency Department (HOSPITAL_COMMUNITY): Payer: BC Managed Care – PPO

## 2022-04-08 ENCOUNTER — Other Ambulatory Visit: Payer: Self-pay

## 2022-04-08 ENCOUNTER — Emergency Department (HOSPITAL_COMMUNITY)
Admission: EM | Admit: 2022-04-08 | Discharge: 2022-04-09 | Disposition: A | Discharge status: Home / Self Care | Payer: BC Managed Care – PPO | Attending: Emergency Medicine | Admitting: Emergency Medicine

## 2022-04-08 ENCOUNTER — Encounter (HOSPITAL_COMMUNITY): Payer: Self-pay | Admitting: Emergency Medicine

## 2022-04-08 DIAGNOSIS — Z86010 Personal history of colonic polyps: Secondary | ICD-10-CM | POA: Diagnosis not present

## 2022-04-08 DIAGNOSIS — F10929 Alcohol use, unspecified with intoxication, unspecified: Secondary | ICD-10-CM

## 2022-04-08 DIAGNOSIS — F10129 Alcohol abuse with intoxication, unspecified: Secondary | ICD-10-CM | POA: Diagnosis not present

## 2022-04-08 DIAGNOSIS — I6521 Occlusion and stenosis of right carotid artery: Secondary | ICD-10-CM | POA: Diagnosis not present

## 2022-04-08 DIAGNOSIS — Z7984 Long term (current) use of oral hypoglycemic drugs: Secondary | ICD-10-CM | POA: Diagnosis not present

## 2022-04-08 DIAGNOSIS — Z79899 Other long term (current) drug therapy: Secondary | ICD-10-CM | POA: Insufficient documentation

## 2022-04-08 DIAGNOSIS — R4182 Altered mental status, unspecified: Secondary | ICD-10-CM | POA: Diagnosis not present

## 2022-04-08 DIAGNOSIS — E119 Type 2 diabetes mellitus without complications: Secondary | ICD-10-CM | POA: Insufficient documentation

## 2022-04-08 DIAGNOSIS — I10 Essential (primary) hypertension: Secondary | ICD-10-CM | POA: Insufficient documentation

## 2022-04-08 DIAGNOSIS — I639 Cerebral infarction, unspecified: Secondary | ICD-10-CM | POA: Diagnosis not present

## 2022-04-08 DIAGNOSIS — G319 Degenerative disease of nervous system, unspecified: Secondary | ICD-10-CM | POA: Diagnosis not present

## 2022-04-08 LAB — I-STAT CHEM 8, ED
BUN: 14 mg/dL (ref 6–20)
Calcium, Ion: 1.11 mmol/L — ABNORMAL LOW (ref 1.15–1.40)
Chloride: 103 mmol/L (ref 98–111)
Creatinine, Ser: 1.2 mg/dL (ref 0.61–1.24)
Glucose, Bld: 154 mg/dL — ABNORMAL HIGH (ref 70–99)
HCT: 42 % (ref 39.0–52.0)
Hemoglobin: 14.3 g/dL (ref 13.0–17.0)
Potassium: 3.5 mmol/L (ref 3.5–5.1)
Sodium: 140 mmol/L (ref 135–145)
TCO2: 22 mmol/L (ref 22–32)

## 2022-04-08 LAB — I-STAT VENOUS BLOOD GAS, ED
Acid-base deficit: 4 mmol/L — ABNORMAL HIGH (ref 0.0–2.0)
Bicarbonate: 21.9 mmol/L (ref 20.0–28.0)
Calcium, Ion: 1.09 mmol/L — ABNORMAL LOW (ref 1.15–1.40)
HCT: 42 % (ref 39.0–52.0)
Hemoglobin: 14.3 g/dL (ref 13.0–17.0)
O2 Saturation: 96 %
Potassium: 3.5 mmol/L (ref 3.5–5.1)
Sodium: 139 mmol/L (ref 135–145)
TCO2: 23 mmol/L (ref 22–32)
pCO2, Ven: 43.6 mmHg — ABNORMAL LOW (ref 44–60)
pH, Ven: 7.31 (ref 7.25–7.43)
pO2, Ven: 91 mmHg — ABNORMAL HIGH (ref 32–45)

## 2022-04-08 LAB — COMPREHENSIVE METABOLIC PANEL
ALT: 54 U/L — ABNORMAL HIGH (ref 0–44)
AST: 35 U/L (ref 15–41)
Albumin: 4.3 g/dL (ref 3.5–5.0)
Alkaline Phosphatase: 51 U/L (ref 38–126)
Anion gap: 11 (ref 5–15)
BUN: 14 mg/dL (ref 6–20)
CO2: 20 mmol/L — ABNORMAL LOW (ref 22–32)
Calcium: 9.1 mg/dL (ref 8.9–10.3)
Chloride: 106 mmol/L (ref 98–111)
Creatinine, Ser: 0.96 mg/dL (ref 0.61–1.24)
GFR, Estimated: >60 mL/min (ref >60)
Glucose, Bld: 189 mg/dL — ABNORMAL HIGH (ref 70–99)
Potassium: 3.7 mmol/L (ref 3.5–5.1)
Sodium: 137 mmol/L (ref 135–145)
Total Bilirubin: 0.4 mg/dL (ref 0.3–1.2)
Total Protein: 7.4 g/dL (ref 6.5–8.1)

## 2022-04-08 LAB — AMMONIA: Ammonia: 36 umol/L — ABNORMAL HIGH (ref 9–35)

## 2022-04-08 LAB — CBC
HCT: 44.9 % (ref 39.0–52.0)
Hemoglobin: 15.5 g/dL (ref 13.0–17.0)
MCH: 29.5 pg (ref 26.0–34.0)
MCHC: 34.5 g/dL (ref 30.0–36.0)
MCV: 85.4 fL (ref 80.0–100.0)
Platelets: 229 10*3/uL (ref 150–400)
RBC: 5.26 MIL/uL (ref 4.22–5.81)
RDW: 12.7 % (ref 11.5–15.5)
WBC: 4.5 10*3/uL (ref 4.0–10.5)
nRBC: 0 % (ref 0.0–0.2)

## 2022-04-08 LAB — ETHANOL: Alcohol, Ethyl (B): 379 mg/dL (ref <10)

## 2022-04-08 LAB — ACETAMINOPHEN LEVEL: Acetaminophen (Tylenol), Serum: <10 ug/mL — ABNORMAL LOW (ref 10–30)

## 2022-04-08 LAB — SALICYLATE LEVEL: Salicylate Lvl: <7 mg/dL — ABNORMAL LOW (ref 7.0–30.0)

## 2022-04-08 MED ORDER — ONDANSETRON HCL 4 MG/2ML IJ SOLN
4.0000 mg | Freq: Once | INTRAMUSCULAR | Status: AC
  Administered 2022-04-08: 4 mg via INTRAVENOUS

## 2022-04-08 MED ORDER — IOHEXOL 350 MG/ML SOLN
75.0000 mL | Freq: Once | INTRAVENOUS | Status: AC | PRN
  Administered 2022-04-08: 75 mL via INTRAVENOUS

## 2022-04-08 MED ORDER — SODIUM CHLORIDE 0.9 % IV BOLUS
1000.0000 mL | Freq: Once | INTRAVENOUS | Status: AC
  Administered 2022-04-08: 1000 mL via INTRAVENOUS

## 2022-04-08 MED ORDER — LORAZEPAM 2 MG/ML IJ SOLN
INTRAMUSCULAR | Status: AC
  Filled 2022-04-08: qty 1

## 2022-04-08 NOTE — ED Provider Triage Note (Signed)
Emergency Medicine Provider Triage Evaluation Note  Kyle Schneider , a 56 y.o. male  was evaluated in triage.  Pt complains of apparent alcohol intoxication.  His wife states that the patient is a known alcoholic and the patient appears to be less ambulatory and not responding correctly verbally at this time.  The patient is also diabetic but has a normal capillary blood glucose in triage.   Review of Systems  Positive: As above Negative: As above  Physical Exam  BP 105/68 (BP Location: Right Arm)   Pulse 83   Temp 98.7 F (37.1 C) (Oral)   Resp 18   Wt 81.6 kg   SpO2 92%   BMI 27.37 kg/m  Gen:   Stuporous, arousable to painful and verbal stimuli, not oriented to place, event, time Resp:  Normal effort  MSK:   Patient slumping in chair, not participatory in any exam Other:    Medical Decision Making  Medically screening exam initiated at 8:01 PM.  Appropriate orders placed.  Kyle Schneider was informed that the remainder of the evaluation will be completed by another provider, this initial triage assessment does not replace that evaluation, and the importance of remaining in the ED until their evaluation is complete.  Patient has no obvious unilateral issues at this time but is only responsive intermittently to name and painful stimuli.  Patient's wife states she only saw her have 1 drink at dinner and that he was acting normally when they arrived at dinner and quickly progressed to this altered state.  Code medical activated  I feel this is likely alcohol related based on history and patient's wife's description, and although I see no indication to activate a code stroke I do have concerns about potential head bleed or other intracranial abnormalities.   Kyle Schneider 04/08/22 2007

## 2022-04-08 NOTE — ED Notes (Signed)
$'4mg'K$  zofran given IV by stroke RN

## 2022-04-08 NOTE — ED Triage Notes (Signed)
Presents for alcohol intoxication (only known consumption of one beer today at dinner but family notes that he is a known alcoholic).  H/o DM, CBG '164mg'$ /dL in triage.  Family concerned today as patient is not verbally responding correctly or ambulatory.  Pt only responds with his name in triage.

## 2022-04-08 NOTE — ED Notes (Signed)
Dr Pearline Cables informed of patient's ethanol level of 379. No new verbal orders received.

## 2022-04-08 NOTE — ED Provider Notes (Signed)
Blood pressure 102/62, pulse 75, temperature 98.7 F (37.1 C), temperature source Oral, resp. rate 20, weight 81.6 kg, SpO2 95 %.  Assuming care from Dr. Pearline Cables.  In short, Kyle Schneider is a 56 y.o. male with a chief complaint of Alcohol Intoxication .  Refer to the original H&P for additional details.  The current plan of care is to metabolize and reassess. EtOH very high. No CVA on MRI.   05:58 AM  Patient is sobering. He is ambulatory to the bathroom and tolerating PO. Family here to drive him home. Stable for d/c. Local resources provided at discharge.    Margette Fast, MD 04/09/22 [phone]

## 2022-04-08 NOTE — ED Notes (Signed)
[place] 1810. Not on a blood thinner.

## 2022-04-08 NOTE — ED Notes (Signed)
Pt back form ct, able to get one tube of blood pt fighting against staff to retrieve second tube, will revisit

## 2022-04-08 NOTE — Consult Note (Signed)
Neurology Consultation Reason for Consult: Decreased responsiveness Referring Physician: Earl Lites  CC: Decreased responsiveness  History is obtained from: Chart review  HPI: Kyle Schneider is a 56 y.o. male.  With a history of hypertension, hyperlipidemia, anxiety, diabetes who presents with decreased responsiveness that has been progressive over the course of the evening.  He was eating dinner a couple of hours ago and acting relatively normally.  He reportedly had a single drink with dinner and then just was "going downhill."  He came in through triage and a code medical was initially activated to expedite his CAT scan and he had a CT head which was negative.  Code stroke was subsequently activated and he was taken back for CT angiogram emergently which was negative.  His exam was nonfocal with some features concerning for nonorganic etiology and therefore he was taken for an emergent MRI.   LKW: 1810 tnk given?: no, not a stroke     Past Medical History:  Diagnosis Date   Allergic rhinitis 08/15/2015   Anxiety disorder 08/15/2015   Arthritis    Atopic dermatitis 06/29/2019   Atypical chest pain 02/11/2017   Benign neoplasm of colon 08/15/2015   Formatting of this note might be different from the original. [place].   Benign prostatic hyperplasia with urinary frequency 08/15/2015   Bilateral hearing loss 05/18/2017   Borderline diabetes 02/11/2017   BPH (benign prostatic hyperplasia)    DDD (degenerative disc disease), lumbosacral 08/15/2015   Dyslipidemia 02/11/2017   Essential hypertension 02/11/2017   ETD (Eustachian tube dysfunction), bilateral 05/18/2017   GERD without esophagitis 08/15/2015   Glaucoma 08/15/2015   Hemorrhoids, internal, with bleeding 06/18/2016   High risk medication use 07/24/2016   Hydrocele of testis 08/15/2015   Hyperlipidemia    Hypertension    IBS (irritable bowel syndrome) 08/15/2015   Intrinsic sphincter deficiency 04/19/1094   Nonalcoholic fatty liver disease without  nonalcoholic steatohepatitis ([facility]) 06/29/2019   Formatting of this note might be different from the original. Abnormal LFTs. [place] with Fatty liver.   Overweight 08/15/2015   Palpitations 08/15/2015   Scoliosis    Type 2 diabetes mellitus without complication, without long-term current use of insulin ([place]) 05/30/2016   Formatting of this note might be different from the original. A1C=6.5 on 05/2017   Unequal leg length 08/15/2015   Varicose veins of leg with pain left leg     Family History  Problem Relation Age of Onset   Alzheimer's disease Mother    CAD Father    Lymphoma Sister    Heart disease Paternal Grandfather      Social History:  reports that he has never smoked. He has never used smokeless tobacco. He reports current alcohol use. He reports that he does not use drugs.   Exam: Current vital signs: BP 105/68 (BP Location: Right Arm)   Pulse 83   Temp 98.7 F (37.1 C) (Oral)   Resp 18   Wt 81.6 kg   SpO2 92%   BMI 27.37 kg/m  Vital signs in last 24 hours: Temp:  [98.7 F (37.1 C)] 98.7 F (37.1 C) (12/26 1958) Pulse Rate:  [83] 83 (12/26 1958) Resp:  [18] 18 (12/26 1958) BP: (105)/(68) 105/68 (12/26 1958) SpO2:  [92 %] 92 % (12/26 1958) Weight:  [81.6 kg] 81.6 kg (12/26 1957)   Physical Exam  Appears well-developed and well-nourished.   Neuro: Mental Status: Patient is lethargic, but with strong noxious stimulation, He does answer some simple yes/no questions with dysarthria.  Cranial Nerves: II: He does not blink to threat. Pupils are equal, round, and reactive to light.   III,IV, VI: Doll's eye intact V:VII: He blinks to eyelid stimulation bilaterally Motor: He has normal tone, he just allows his arms to flop flaccidly to the bed, but does avoid his face if held in front of sensory: He response to noxious stimulation in all four extremities Deep Tendon Reflexes: 2+ and symmetric in the biceps and patellae.  No clonus Cerebellar: Does not  perform     I have reviewed labs in epic and the results pertinent to this consultation are: Ethanol 379  I have reviewed the images obtained: CT/CTA-negative, MRI-negative  Impression: 56 year old male who presents with a rapidly progressive encephalopathy.  Given the initial concern for possible stroke and the acuity of onset, he was taken for an emergent workup including CTA and MRI, which is negative.  The overall description I think is much more consistent with toxic encephalopathy due to alcohol, consistent with his elevated ethanol level.  Recommendations: 1) agree with checking ammonia 2) allow ethanol to metabolize 3) if he does not improve as would be expected from a toxic encephalopathy, please call for further recommendations.  Otherwise neurology will be available on an as-needed basis.   Roland Rack, MD [facility] [phone]  If 7pm- 7am, please page neurology on call as listed in [place].

## 2022-04-08 NOTE — ED Notes (Signed)
Arrived in MRI

## 2022-04-08 NOTE — ED Provider Notes (Signed)
[facility] Provider Note  CSN: [id number] Arrival date & time: 04/08/22 1944  Chief Complaint(s) Alcohol Intoxication  HPI Kyle Schneider is a 56 y.o. male   with past medical history as below, significant for etoh abuse, BPH, HLD, gerd, HTN who presents to the ED with complaint of ams Pt hx chronic etoh abuse Per family altered pta, LKN is unclear at this time In triage pt Aox1 to name only, somnolent Unable to provide much history Family is not present upon my evaluation Stroke alert was activated and pt sent to CT from triage Neurology team at bedside to eval Level 5 caveat AMS  Past Medical History Past Medical History:  Diagnosis Date   Allergic rhinitis 08/15/2015   Anxiety disorder 08/15/2015   Arthritis    Atopic dermatitis 06/29/2019   Atypical chest pain 02/11/2017   Benign neoplasm of colon 08/15/2015   Formatting of this note might be different from the original. [place].   Benign prostatic hyperplasia with urinary frequency 08/15/2015   Bilateral hearing loss 05/18/2017   Borderline diabetes 02/11/2017   BPH (benign prostatic hyperplasia)    DDD (degenerative disc disease), lumbosacral 08/15/2015   Dyslipidemia 02/11/2017   Essential hypertension 02/11/2017   ETD (Eustachian tube dysfunction), bilateral 05/18/2017   GERD without esophagitis 08/15/2015   Glaucoma 08/15/2015   Hemorrhoids, internal, with bleeding 06/18/2016   High risk medication use 07/24/2016   Hydrocele of testis 08/15/2015   Hyperlipidemia    Hypertension    IBS (irritable bowel syndrome) 08/15/2015   Intrinsic sphincter deficiency 09/17/628   Nonalcoholic fatty liver disease without nonalcoholic steatohepatitis (NASH) 06/29/2019   Formatting of this note might be different from the original. Abnormal LFTs. [place] with Fatty liver.   Overweight 08/15/2015   Palpitations 08/15/2015   Scoliosis    Type 2 diabetes mellitus without complication, without long-term current use of  insulin ([place]) 05/30/2016   Formatting of this note might be different from the original. A1C=6.5 on 05/2017   Unequal leg length 08/15/2015   Varicose veins of leg with pain left leg   Patient Active Problem List   Diagnosis Date Noted   Varicose veins of leg with pain    Scoliosis    Hypertension    Hyperlipidemia    BPH (benign prostatic hyperplasia)    Arthritis    Atopic dermatitis 16/04/930   Nonalcoholic fatty liver disease without nonalcoholic steatohepatitis (NASH) 06/29/2019   Bilateral hearing loss 05/18/2017   ETD (Eustachian tube dysfunction), bilateral 05/18/2017   Atypical chest pain 02/11/2017   Borderline diabetes 02/11/2017   Dyslipidemia 02/11/2017   Essential hypertension 02/11/2017   High risk medication use 07/24/2016   Hemorrhoids, internal, with bleeding 06/18/2016   Type 2 diabetes mellitus without complication, without long-term current use of insulin ([place]) 05/30/2016   Allergic rhinitis 08/15/2015   Anxiety disorder 08/15/2015   Benign neoplasm of colon 08/15/2015   Benign prostatic hyperplasia with urinary frequency 08/15/2015   DDD (degenerative disc disease), lumbosacral 08/15/2015   GERD without esophagitis 08/15/2015   Glaucoma 08/15/2015   Hydrocele of testis 08/15/2015   IBS (irritable bowel syndrome) 08/15/2015   Intrinsic sphincter deficiency 08/15/2015   Overweight 08/15/2015   Palpitations 08/15/2015   Unequal leg length 08/15/2015   Home Medication(s) Prior to Admission medications   Medication Sig Start Date End Date Taking? Authorizing Provider  amLODipine (NORVASC) 5 MG tablet Take 5 mg by mouth daily. 04/17/21   [provider]  bimatoprost (LUMIGAN) 0.01 %  SOLN Place 1 drop into both eyes at bedtime.    [provider]  dicyclomine (BENTYL) 20 MG tablet Take 1 tablet (20 mg total) by mouth 2 (two) times daily as needed for up to 20 doses for spasms. Abdominal cramping 06/15/21   Wyvonnia Dusky, MD  esomeprazole  (NEXIUM) 40 MG capsule Take 40 mg by mouth daily.    [provider]  famotidine (PEPCID) 40 MG tablet Take 40 mg by mouth at bedtime. 01/11/20   [provider]  metFORMIN (GLUCOPHAGE-XR) 500 MG 24 hr tablet Take 500 mg by mouth at bedtime. 06/10/21   [provider]  olmesartan (BENICAR) 20 MG tablet Take 20 mg by mouth daily. 05/30/16   [provider]  ondansetron (ZOFRAN-ODT) 4 MG disintegrating tablet Take 1 tablet (4 mg total) by mouth every 8 (eight) hours as needed for up to 15 doses for nausea or vomiting. 06/15/21   Wyvonnia Dusky, MD  oxyCODONE (ROXICODONE) 5 MG immediate release tablet Take 1 tablet (5 mg total) by mouth every 4 (four) hours as needed for up to 10 doses for severe pain. 06/15/21   Wyvonnia Dusky, MD  polycarbophil (FIBERCON) 625 MG tablet Take 1,250 mg by mouth every morning.    [provider]  rosuvastatin (CRESTOR) 10 MG tablet Take 10 mg by mouth at bedtime. 05/17/20   [provider]  scopolamine (TRANSDERM-SCOP) 1 MG/3DAYS Place 1 patch onto the skin every 3 (three) days. 06/03/21   [provider]  senna-docusate (SENOKOT-S) 8.6-50 MG tablet Take 1 tablet by mouth daily as needed for up to 20 doses for mild constipation. 06/15/21   Wyvonnia Dusky, MD  Tadalafil 2.5 MG TABS Take 2.5 mg by mouth 2 (two) times daily. 02/05/21   [provider]                                                                                                                                    Past Surgical History Past Surgical History:  Procedure Laterality Date   ENDOVENOUS ABLATION SAPHENOUS VEIN W/ LASER  12-04-2010 Left Greater SaphenousVein    STAB PHLEBECTOMY  12-04-2010 LEFT LEG 10-20 INCISIONS   VASECTOMY     VASECTOMY     Family History Family History  Problem Relation Age of Onset   Alzheimer's disease Mother    CAD Father    Lymphoma Sister    Heart disease Paternal Grandfather     Social  History Social History   Tobacco Use   Smoking status: Never   Smokeless tobacco: Never  Vaping Use   Vaping Use: Never used  Substance Use Topics   Alcohol use: Yes    Comment: 3-5 glasses of wine/beer weekly   Drug use: No   Allergies Patient has no known allergies.  Review of Systems Review of Systems  Unable to perform ROS: Mental status change  Physical Exam Vital Signs  I have reviewed the triage vital signs BP 94/63   Pulse 70   Temp 98.7 F (37.1 C) (Oral)   Resp 17   Wt 81.6 kg   SpO2 91%   BMI 27.37 kg/m  Physical Exam Vitals and nursing note reviewed. Exam conducted with a chaperone present.  Constitutional:      General: He is not in acute distress.    Appearance: He is well-developed. He is obese. He is not diaphoretic.  HENT:     Head: Normocephalic and atraumatic. No raccoon eyes, right periorbital erythema or left periorbital erythema.     Right Ear: External ear normal.     Left Ear: External ear normal.     Mouth/Throat:     Mouth: Mucous membranes are moist.  Eyes:     General: No scleral icterus.    Conjunctiva/sclera: Conjunctivae normal.     Pupils: Pupils are equal, round, and reactive to light.     Comments: Poor compliance with neuro testing  Cardiovascular:     Rate and Rhythm: Normal rate and regular rhythm.     Pulses: Normal pulses.     Heart sounds: Normal heart sounds.  Pulmonary:     Effort: Pulmonary effort is normal. No respiratory distress.     Breath sounds: Normal breath sounds.  Abdominal:     General: Abdomen is flat. There is no distension.     Palpations: Abdomen is soft.     Tenderness: There is no abdominal tenderness.  Musculoskeletal:        General: Normal range of motion.     Cervical back: Normal range of motion.     Right lower leg: No edema.     Left lower leg: No edema.  Skin:    General: Skin is warm and dry.     Capillary Refill: Capillary refill takes less than 2 seconds.  Neurological:      Mental Status: He is lethargic.     GCS: GCS eye subscore is 4. GCS verbal subscore is 2. GCS motor subscore is 4.     Cranial Nerves: No facial asymmetry.     Motor: No tremor or seizure activity.     Comments: Poor compliance with neuro testing Inappropriate intelligible responses to questions Withdraws from noxious stimulus in all 4 extremities No clonus    Psychiatric:        Mood and Affect: Mood normal.        Behavior: Behavior normal.     ED Results and Treatments Labs (all labs ordered are listed, but only abnormal results are displayed) Labs Reviewed  COMPREHENSIVE METABOLIC PANEL - Abnormal; Notable for the following components:      Result Value   CO2 20 (*)    Glucose, Bld 189 (*)    ALT 54 (*)    All other components within normal limits  ETHANOL - Abnormal; Notable for the following components:   Alcohol, Ethyl (B) 379 (*)    All other components within normal limits  SALICYLATE LEVEL - Abnormal; Notable for the following components:   Salicylate Lvl <3.4 (*)    All other components within normal limits  ACETAMINOPHEN LEVEL - Abnormal; Notable for the following components:   Acetaminophen (Tylenol), Serum <10 (*)    All other components within normal limits  I-STAT CHEM 8, ED - Abnormal; Notable for the following components:   Glucose, Bld 154 (*)    Calcium, Ion 1.11 (*)  All other components within normal limits  I-STAT VENOUS BLOOD GAS, ED - Abnormal; Notable for the following components:   pCO2, Ven 43.6 (*)    pO2, Ven 91 (*)    Acid-base deficit 4.0 (*)    Calcium, Ion 1.09 (*)    All other components within normal limits  CBC  RAPID URINE DRUG SCREEN, HOSP PERFORMED  URINALYSIS, ROUTINE W REFLEX MICROSCOPIC  OSMOLALITY  PROTIME-INR  APTT  AMMONIA                                                                                                                          Radiology MR BRAIN WO CONTRAST  Result Date: 04/08/2022 CLINICAL DATA:   Follow-up examination for acute stroke. EXAM: MRI HEAD WITHOUT CONTRAST TECHNIQUE: Multiplanar, multiecho pulse sequences of the brain and surrounding structures were obtained without intravenous contrast. COMPARISON:  Prior CTs from earlier the same day. FINDINGS: Brain: Mildly advanced cerebellar atrophy for age. No focal parenchymal signal abnormality or significant cerebral white matter disease for age. No abnormal foci of restricted diffusion to suggest acute or subacute ischemia. Javiana Anwar-white matter differentiation well maintained. No encephalomalacia to suggest chronic cortical infarction or other insult. No foci of susceptibility artifact indicative of acute or chronic intracranial blood products. No mass lesion, midline shift or mass effect. Ventricles normal in size and morphology without hydrocephalus. No extra-axial fluid collection. Pituitary gland and suprasellar region within normal limits. Vascular: Major intracranial vascular flow voids are well maintained. Skull and upper cervical spine: Craniocervical junction within normal limits. Visualized upper cervical spine demonstrates no significant finding. Bone marrow signal intensity within normal limits. No scalp soft tissue abnormality. Sinuses/Orbits: Globes and orbital soft tissues are within normal limits. Mild scattered thickening noted about the ethmoidal air cells and maxillary sinuses. Paranasal sinuses are otherwise clear. Trace bilateral mastoid effusions, of doubtful significance. Other: None. IMPRESSION: 1. No acute intracranial abnormality. 2. Mildly advanced cerebellar atrophy for age. Electronically Signed   By: Jeannine Boga M.D.   On: 04/08/2022 21:57   CT ANGIO HEAD NECK W WO CM (CODE STROKE)  Result Date: 04/08/2022 CLINICAL DATA:  Initial evaluation for neuro deficit, stroke suspected. Next of prior head CT from earlier the same day. EXAM: CT ANGIOGRAPHY HEAD AND NECK TECHNIQUE: Multidetector CT imaging of the head and  neck was performed using the standard protocol during bolus administration of intravenous contrast. Multiplanar CT image reconstructions and MIPs were obtained to evaluate the vascular anatomy. Carotid stenosis measurements (when applicable) are obtained utilizing NASCET criteria, using the distal internal carotid diameter as the denominator. RADIATION DOSE REDUCTION: This exam was performed according to the departmental dose-optimization program which includes automated exposure control, adjustment of the mA and/or kV according to patient size and/or use of iterative reconstruction technique. CONTRAST:  8m OMNIPAQUE IOHEXOL 350 MG/ML SOLN COMPARISON:  Prior CT from earlier the same day. FINDINGS: CTA NECK FINDINGS Aortic arch: Visualized aortic arch normal caliber with standard branch pattern. No  stenosis about the origin the great vessels. Right carotid system: Right common and internal carotid arteries are patent without dissection. Minimal plaque about the right carotid bulb without hemodynamically significant greater than 50% stenosis. Left carotid system: Left common and internal carotid arteries are patent without dissection. No hemodynamically significant stenosis about the left carotid artery system. Vertebral arteries: Both vertebral arteries arise from subclavian arteries. No proximal subclavian artery stenosis. Left vertebral artery dominant. Vertebral arteries patent without stenosis or dissection. Skeleton: No discrete or worrisome osseous lesions. 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 Other neck: No other acute soft tissue abnormality within the neck. 1 cm right thyroid nodule noted, of doubtful significance given size and patient age, no follow-up imaging recommended (ref: J Am Coll Radiol. 2015 Feb;12(2): 143-50). Upper chest: Visualized upper chest demonstrates no acute finding. Review of the MIP images confirms the above findings CTA HEAD FINDINGS Anterior circulation: Both  internal carotid arteries widely patent to the termini without stenosis. A1 segments widely patent. Normal anterior communicating artery complex. Both anterior cerebral arteries widely patent to their distal aspects without stenosis. No M1 stenosis or occlusion. Normal MCA bifurcations. Distal MCA branches well perfused and symmetric. Posterior circulation: Both V4 segments patent to the vertebrobasilar junction without stenosis. Both PICA origins patent and normal. Basilar widely patent to its distal aspect without stenosis. Superior cerebellar arteries patent bilaterally. Both PCAs primarily supplied via the basilar and are well perfused to there distal aspects. Venous sinuses: Patent allowing for timing the contrast bolus. Anatomic variants: None significant.  No aneurysm. Review of the MIP images confirms the above findings IMPRESSION: Negative CTA of the head and neck. No large vessel occlusion or other emergent finding. No hemodynamically significant or correctable stenosis. These results were communicated to Dr. Leonel Ramsay at 9:24 pm on 04/08/2022 by text page via the [facility] messaging system. Electronically Signed   By: Jeannine Boga M.D.   On: 04/08/2022 21:25   CT Head Wo Contrast  Result Date: 04/08/2022 CLINICAL DATA:  Altered mental status. EXAM: CT HEAD WITHOUT CONTRAST TECHNIQUE: Contiguous axial images were obtained from the base of the skull through the vertex without intravenous contrast. RADIATION DOSE REDUCTION: This exam was performed according to the departmental dose-optimization program which includes automated exposure control, adjustment of the mA and/or kV according to patient size and/or use of iterative reconstruction technique. COMPARISON:  MRI Brain 09/09/21 FINDINGS: Brain: No evidence of acute infarction, hemorrhage, hydrocephalus, extra-axial collection or mass lesion/mass effect. Vascular: No hyperdense vessel or unexpected calcification. Skull: Normal. Negative for  fracture or focal lesion. Sinuses/Orbits: No acute finding. Other: None IMPRESSION: No hemorrhage or CT evidence of an acute infarct. Findings were paged to Dr. Leonel Ramsay on 04/08/22 at 8:38 PM via [facility] paging system. Electronically Signed   By: Marin Roberts M.D.   On: 04/08/2022 20:39    Pertinent labs & imaging results that were available during my care of the patient were reviewed by me and considered in my medical decision making (see MDM for details).  Medications Ordered in ED Medications  ondansetron (ZOFRAN) injection 4 mg (4 mg Intravenous Given 04/08/22 2046)  iohexol (OMNIPAQUE) 350 MG/ML injection 75 mL (75 mLs Intravenous Contrast Given 04/08/22 2100)  LORazepam (ATIVAN) 2 MG/ML injection (  Given 04/08/22 2214)  sodium chloride 0.9 % bolus 1,000 mL (1,000 mLs Intravenous New Bag/Given 04/08/22 2227)  Procedures .Critical Care  Performed by: Jeanell Sparrow, DO Authorized by: Jeanell Sparrow, DO   Critical care provider statement:    Critical care time (minutes):  30   Critical care time was exclusive of:  Separately billable procedures and treating other patients   Critical care was necessary to treat or prevent imminent or life-threatening deterioration of the following conditions:  CNS failure or compromise and toxidrome   Critical care was time spent personally by me on the following activities:  Development of treatment plan with patient or surrogate, discussions with consultants, evaluation of patient's response to treatment, examination of patient, ordering and review of laboratory studies, ordering and review of radiographic studies, ordering and performing treatments and interventions, pulse oximetry, re-evaluation of patient's condition, review of old charts and obtaining history from patient or surrogate   (including critical care  time)  Medical Decision Making / ED Course   MDM:  Kyle Schneider is a 56 y.o. male   with past medical history as below, significant for etoh abuse, BPH, HLD, gerd, HTN who presents to the ED with complaint of ams. The complaint involves an extensive differential diagnosis and also carries with it a high risk of complications and morbidity.  Serious etiology was considered. Ddx includes but is not limited to: Differential diagnoses for altered mental status includes but is not exclusive to alcohol, illicit or prescription medications, intracranial pathology such as stroke, intracerebral hemorrhage, fever or infectious causes including sepsis, hypoxemia, uremia, trauma, endocrine related disorders such as diabetes, hypoglycemia, thyroid-related diseases, etc.  Stroke alert was activated on arrival, sent to CT from triage. Airway cleared by myself. Stroke team at bedside  Stroke related imaging was reviewed and was WNL. No stroke per neurology team  Ethanol was acutely elevated, he has hx chronic etoh abuse and ingestion pta was reported by family  Started on IV fluids, intermittently agitated  Pt signed out to incoming EDP pending remainder of labs and further metabolization.    Clinical Course as of 04/08/22 2347  Tue Apr 08, 2022  2222 Pt re-assessed, sleeping/snoring loudly. Will rouse upon stimulation, answers questions intermittently.  [SG]    Clinical Course User Index [SG] Jeanell Sparrow, DO     Additional history obtained: -Additional history obtained from na -External records from outside source obtained and reviewed including: Chart review including previous notes, labs, imaging, consultation notes including prior ed visits, prior labs/imaging/home meds    Lab Tests: -I ordered, reviewed, and interpreted labs.   The pertinent results include:   Labs Reviewed  COMPREHENSIVE METABOLIC PANEL - Abnormal; Notable for the following components:      Result Value   CO2 20 (*)     Glucose, Bld 189 (*)    ALT 54 (*)    All other components within normal limits  ETHANOL - Abnormal; Notable for the following components:   Alcohol, Ethyl (B) 379 (*)    All other components within normal limits  SALICYLATE LEVEL - Abnormal; Notable for the following components:   Salicylate Lvl <3.0 (*)    All other components within normal limits  ACETAMINOPHEN LEVEL - Abnormal; Notable for the following components:   Acetaminophen (Tylenol), Serum <10 (*)    All other components within normal limits  I-STAT CHEM 8, ED - Abnormal; Notable for the following components:   Glucose, Bld 154 (*)    Calcium, Ion 1.11 (*)    All other components within normal limits  I-STAT VENOUS BLOOD GAS, ED -  Abnormal; Notable for the following components:   pCO2, Ven 43.6 (*)    pO2, Ven 91 (*)    Acid-base deficit 4.0 (*)    Calcium, Ion 1.09 (*)    All other components within normal limits  CBC  RAPID URINE DRUG SCREEN, HOSP PERFORMED  URINALYSIS, ROUTINE W REFLEX MICROSCOPIC  OSMOLALITY  PROTIME-INR  APTT  AMMONIA    Notable for ethanol ++  EKG   EKG Interpretation  Date/Time:  Tuesday April 08 2022 21:47:58 EST Ventricular Rate:  69 PR Interval:  160 QRS Duration: 104 QT Interval:  396 QTC Calculation: 425 R Axis:   43 Text Interpretation: Sinus rhythm Posterior infarct, old No old tracing to compare no stemi twi noted lead 3 Confirmed by Wynona Dove ([id number]) on 04/08/2022 9:53:10 PM         Imaging Studies ordered: I ordered imaging studies including CTH, CTA head/neck, MRI brain w/o On my interpretation imaging demonstrates no acute process I independently visualized and interpreted imaging. I agree with the radiologist interpretation   Medicines ordered and prescription drug management: Meds ordered this encounter  Medications   ondansetron (ZOFRAN) injection 4 mg   iohexol (OMNIPAQUE) 350 MG/ML injection 75 mL   LORazepam (ATIVAN) 2 MG/ML injection    [place],  Whitney R: cabinet override   sodium chloride 0.9 % bolus 1,000 mL    -I have reviewed the patients home medicines and have made adjustments as needed   Consultations Obtained: I requested consultation with the neurology,  and discussed lab and imaging findings as well as pertinent plan - they recommend: no stroke on imaging, acute cva seems unlikely, recommend investigate other sources of AMS   Cardiac Monitoring: The patient was maintained on a cardiac monitor.  I personally viewed and interpreted the cardiac monitored which showed an underlying rhythm of: NSR  Social Determinants of Health:  Diagnosis or treatment significantly limited by social determinants of health: obesity and alcohol use   Reevaluation: After the interventions noted above, I reevaluated the patient and found that they have improved gradually   Co morbidities that complicate the patient evaluation  Past Medical History:  Diagnosis Date   Allergic rhinitis 08/15/2015   Anxiety disorder 08/15/2015   Arthritis    Atopic dermatitis 06/29/2019   Atypical chest pain 02/11/2017   Benign neoplasm of colon 08/15/2015   Formatting of this note might be different from the original. [place].   Benign prostatic hyperplasia with urinary frequency 08/15/2015   Bilateral hearing loss 05/18/2017   Borderline diabetes 02/11/2017   BPH (benign prostatic hyperplasia)    DDD (degenerative disc disease), lumbosacral 08/15/2015   Dyslipidemia 02/11/2017   Essential hypertension 02/11/2017   ETD (Eustachian tube dysfunction), bilateral 05/18/2017   GERD without esophagitis 08/15/2015   Glaucoma 08/15/2015   Hemorrhoids, internal, with bleeding 06/18/2016   High risk medication use 07/24/2016   Hydrocele of testis 08/15/2015   Hyperlipidemia    Hypertension    IBS (irritable bowel syndrome) 08/15/2015   Intrinsic sphincter deficiency 0/05/5850   Nonalcoholic fatty liver disease without nonalcoholic steatohepatitis (NASH) 06/29/2019    Formatting of this note might be different from the original. Abnormal LFTs. [place] with Fatty liver.   Overweight 08/15/2015   Palpitations 08/15/2015   Scoliosis    Type 2 diabetes mellitus without complication, without long-term current use of insulin ([place]) 05/30/2016   Formatting of this note might be different from the original. A1C=6.5 on 05/2017   Unequal leg length 08/15/2015  Varicose veins of leg with pain left leg      Dispostion: Disposition decision including need for hospitalization was considered, and patient is pending final dispo at shift change.     Final Clinical Impression(s) / ED Diagnoses Final diagnoses:  Altered mental status, unspecified altered mental status type     This chart was dictated using voice recognition software.  Despite best efforts to proofread,  errors can occur which can change the documentation meaning.    Jeanell Sparrow, DO 04/08/22 2347

## 2022-04-09 LAB — RAPID URINE DRUG SCREEN, HOSP PERFORMED
Amphetamines: NOT DETECTED
Barbiturates: NOT DETECTED
Benzodiazepines: NOT DETECTED
Cocaine: NOT DETECTED
Opiates: NOT DETECTED
Tetrahydrocannabinol: NOT DETECTED

## 2022-04-09 NOTE — ED Notes (Signed)
Attempted to get urine from patient but had urinated in brief already. Pt more alert and stated he will notify when he needs to urinate

## 2022-04-09 NOTE — Discharge Instructions (Signed)
Substance Abuse Treatment Programs ° °Intensive Outpatient Programs °[facility]     °[address]      °[place], [place]                   °[phone]      ° °[facility] °[address] °[place], [place] °336-379-7146 ° °[facility]     °(Inpatient and outpatient)     °[address]           °336-832-9800   ° °[facility] °336-288-1484 (Suboxone and Methadone) ° °[address]      °[place], [place] [address]      °336-882-2125      ° °[address] °[place], [place] °852-3033 ° °[facility] (Outpatient/Inpatient, [facility])    °(insurance only) [phone]      °       °[facility] (Groups & Residential) °[place], [place] °336-389-1413 ° °   °[facility]     °[address]     °[place], [place]      °336-389-1413      ° °[facility] (for caregivers and family) °[address] °[place], [place] °336-299-4655 ° ° ° ° ° °Residential Treatment Programs °[facility]      °[address], [place], [place] [address]  °(336) [phone]      ° °[facility] °[address] [place], [place] [address] °[phone] ° °[facility]        °336-248-8914      ° °[facility] °1-800-659-3381 ° °[facility]             °[address]                                         °[place], [place]                                                °877-615-2722 or 336-784-9470                              ° °[facility] [place] °[address] °[place], [address] °1.877.941.8954 ° °[facility]    °[address]      °[place], [place]     °336-273-5306      ° °[facility] °[address] °, [place] °336-285-9073 ° °[facility]   °[address]     °[place], [place] [address]     °336-899-1550      °Admissions: 8am-3pm M-F ° °[facility] °[address] °[place] °336-227-7417 ° °[facility]: Residential Program (90 Days)   °[place], [place]      °336-725-8389 or 800-758-6077    ° °[facility]: [facility] °[place], [place] °(Walk in Hours over the weekend or by referral) ° °[facility] °[address], [place], [place] [address] °(336) [phone] ° °Crisis Mobile: [facility]:  1-877-626-1772 (for crisis response 24 hours a day) °[facility] Hotline:      1-800-256-2452 °Outpatient Psychiatry and Counseling ° °[facility]: Mobile Crisis   Management 24 hours:  1-877-626-1772 ° °[facility] sliding scale fee and walk in schedule: M-F 8am-12pm/1pm-3pm °[address]  °[place], [place] [address] °336-387-6161 ° °[facility] °[address] °[place], [place] [address] °336-703-9650 ° °[facility] ([facility] new patient walk-in appointments available Monday - Friday 8am -3pm.          °[address] °[place], [place] [address] °336-676-6840 or crisis line- 336-676-6905 ° °[facility]/ Intensive Outpatient Therapy Program °[address] °[place], [place] [address] °336-832-9804 ° °[facility]                  °Crisis Services      °336.641.4993      °[address]     °[place], [place] [address]                ° °[facility]   °[facility] °[phone] °[address] °[place], [place] [address] ° ° °[facility]          °[address],  °[place], [place] [address]       °(336) [phone] ° °[facility] °[address] °[place], [place] [address] °336-292-1510 ° °[facility]    °[address]    °[place], [place] [address]     °336-632-3505      ° °[facility], [place]     °[address]     °[place] [address]     °336-282-1251     °  °[facility] °[address] °[place] [address] ° °[facility]     °[address]     °[place], [place]      °336-542-2076      ° °[facility] °Shamsher Ahluwalia, MD °[address] °[place], [place] [address] °336-420-9558 ° °[facility]     °[address]     °[place], [place] [address]     °336-274-1237      ° °[facility] °[address] °[place], [place] [address] °336-854-4450 °Resources for Temporary Residential Assistance/Crisis Centers ° °DAY CENTERS °[facility] °M-F 8am-3pm   °[address] [place], [place] [address]   336-332-0824 °Services include: laundry, barbering, support groups, case management, phone  & computer access, showers, AA/NA mtgs, mental health/substance abuse nurse, job skills class, disability information, VA assistance, spiritual classes, etc.  ° °HOMELESS SHELTERS ° °[facility]     °[facility]   °[address], [place]     °[phone]       °       °[facility] (women and children)       °[address] °[place], [place] [address] °336-275-0820 °[email or web address] for application and process °Application Required ° °[facility]   °[address]    °[place] [address]     °336.886.4922       °             °[facility] °[address] °, [place] [address] °336.273.5572 °336-235-0363(schedule application appt.) °Application Required ° °[facility] (women only)    °[address]     °[place], [place] [address]     °336-884-1039      °  Intake starts 6pm daily °Need valid ID, SSC, & Police report °[facility] [place] °[address] °[place], [place] °336-881-5420 °Application Required ° °[facility] (men only)     °[address]      °[place], [place]     °[phone]      ° °[facility] °(Pregnant women only) °[address] °[place], [place] °336-275-0206 ° °[facility]      °[address]      °[place], [place] [address]     °336-722-9951      °       °[facility] °[address] °[place], [place] °336-723-1848 °90 day commitment/SA/Application process ° °[facility](men only)     °[address]     °[place], [place]     °336-748-1962       °Check-in at 7pm     °       °[facility] °[address] °[place], [place] [address] °336-248-6684 °Men/Women/Women and Children must be there by 7 pm ° °[facility] °[place],  °336-722-8721                ° °

## 2022-04-09 NOTE — ED Notes (Signed)
Pt ambulated to the bathroom and took a few sips of water with no issues MD made aware.

## 2022-04-11 DIAGNOSIS — R748 Abnormal levels of other serum enzymes: Secondary | ICD-10-CM | POA: Diagnosis not present

## 2022-04-21 LAB — CBG MONITORING, ED: Glucose-Capillary: 193 mg/dL — ABNORMAL HIGH (ref 70–99)

## 2022-04-25 IMAGING — CT CT ABD-PELV W/ CM
2 of 5 series · 16 of 46 positions shown, 18 images · IV contrast (APPLIED)
Comparison: 10/11/2009

CLINICAL DATA: Periumbilical abdominal pain, nausea, dry heaving,
bloating

EXAM:
CT ABDOMEN AND PELVIS WITH CONTRAST
TECHNIQUE: Multidetector CT imaging of the abdomen and pelvis was performed
using the standard protocol following bolus administration of
intravenous contrast.

[Series 3: abdomen 5.0 · axial · 0.82mm/px · z∈[+886,+1300]mm · 13 of 97 slices shown, 15 images]
[im 7/97  soft-tissue]
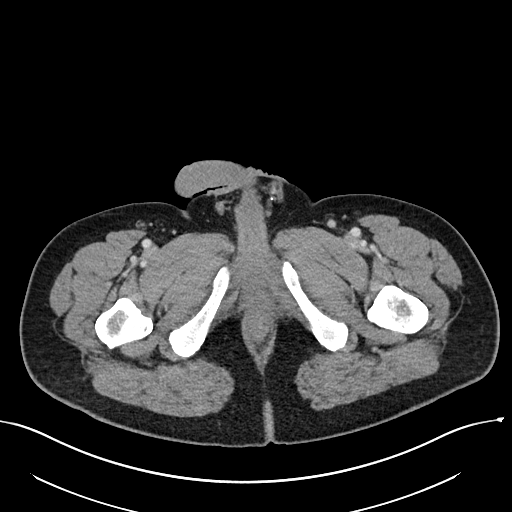
[im 7/97  bone]
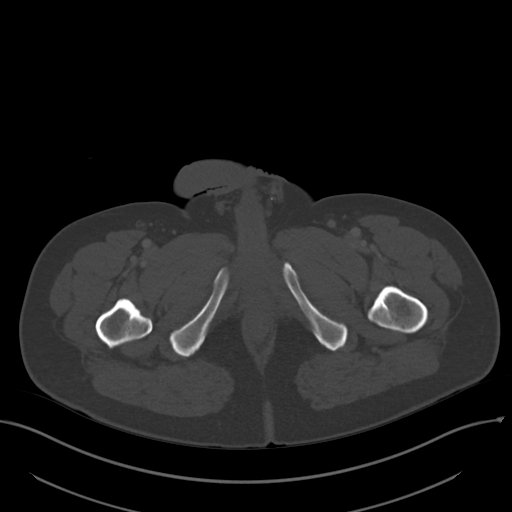
[im 13/97  soft-tissue]
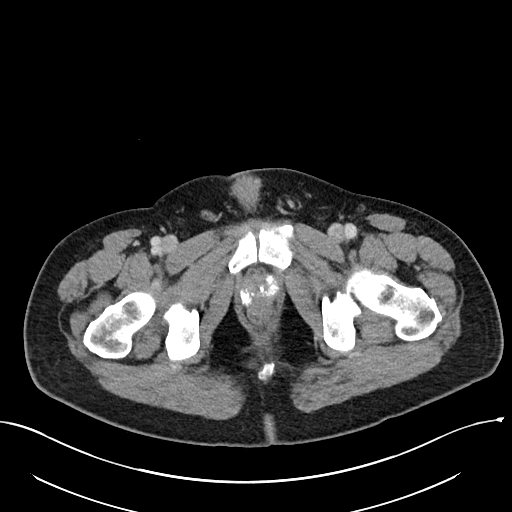
[im 20/97  soft-tissue]
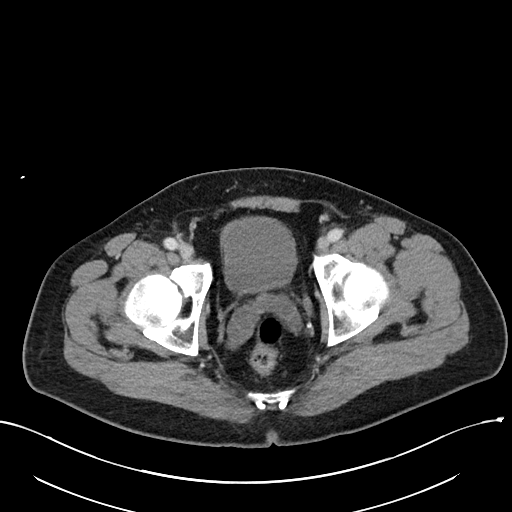
[im 26/97  soft-tissue]
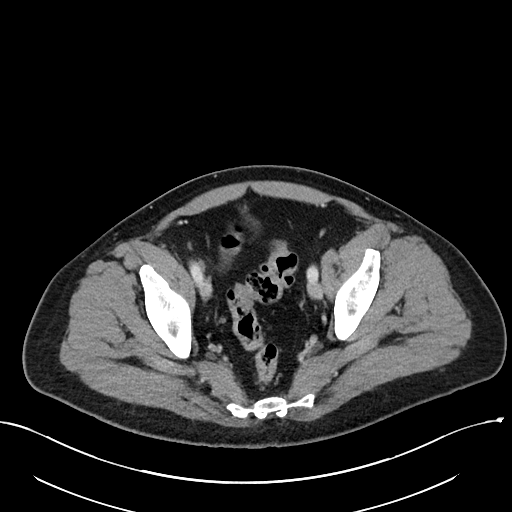
[im 33/97  soft-tissue]
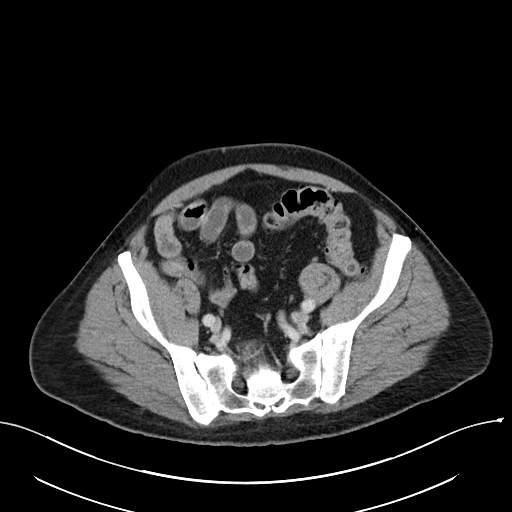
[im 39/97  soft-tissue]
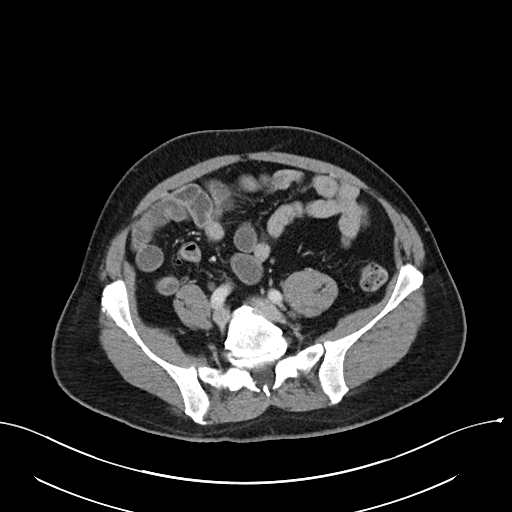
[im 52/97  soft-tissue]
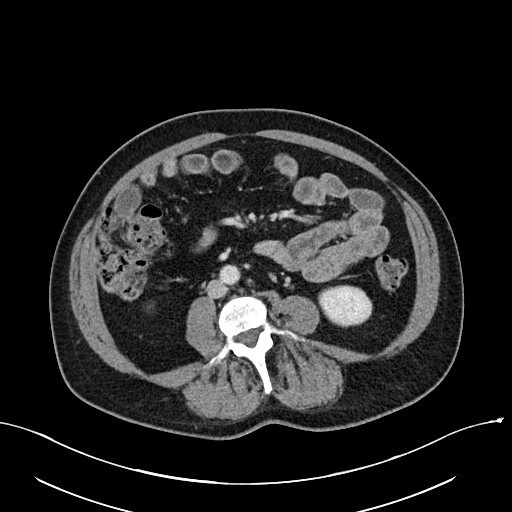
[im 58/97  soft-tissue]
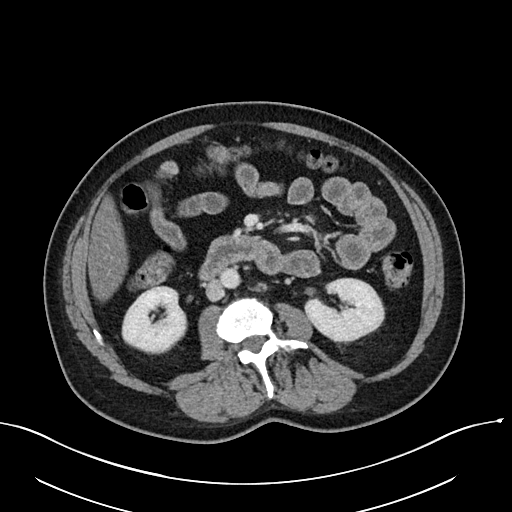
[im 65/97  soft-tissue]
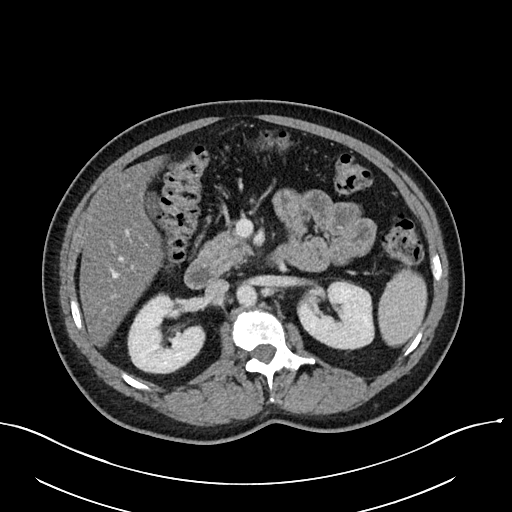
[im 65/97  bone]
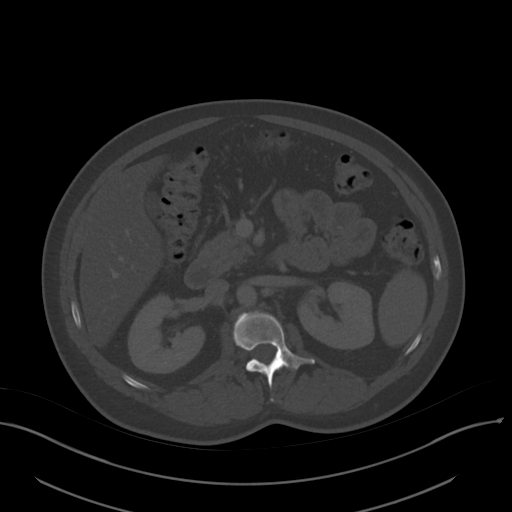
[im 71/97  soft-tissue]
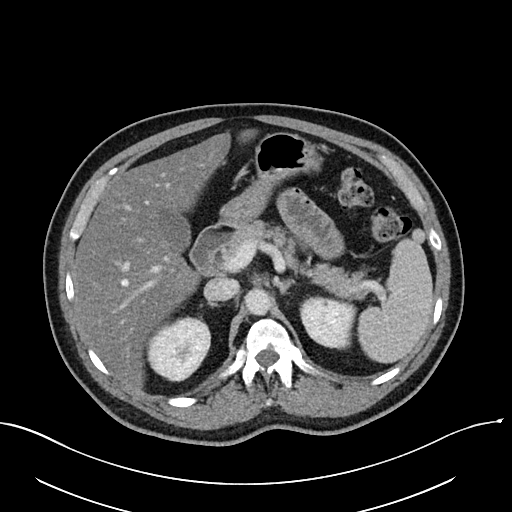
[im 77/97  soft-tissue]
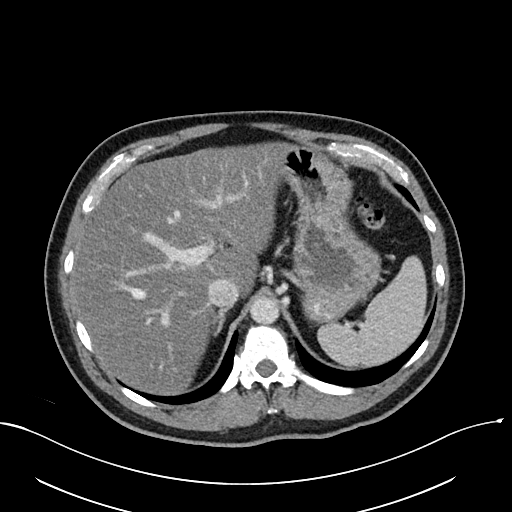
[im 84/97  soft-tissue]
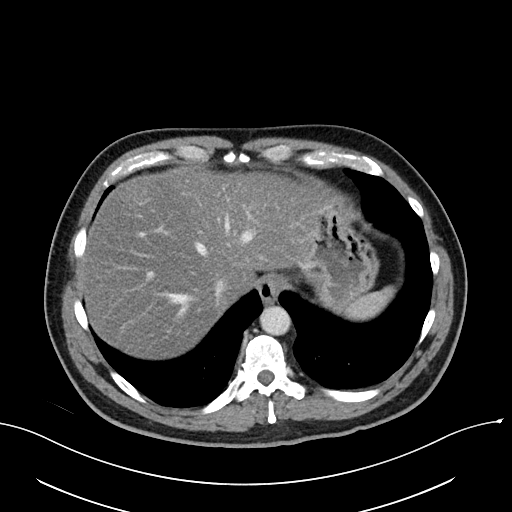
[im 90/97  soft-tissue]
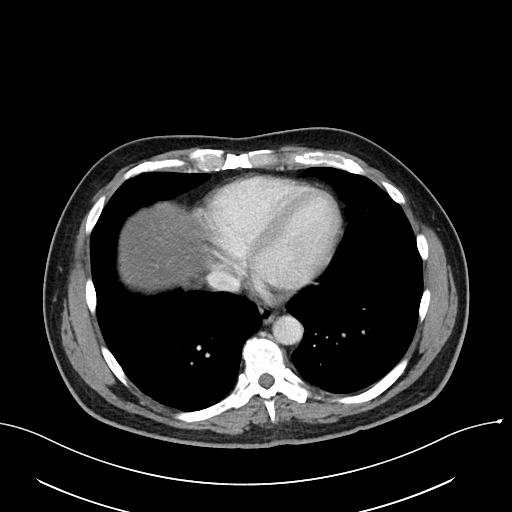

[Series 6: abdomen 3.0 mpr cor · coronal · 0.78mm/px · 3 of 97 slices shown]
[im 33/97  soft-tissue]
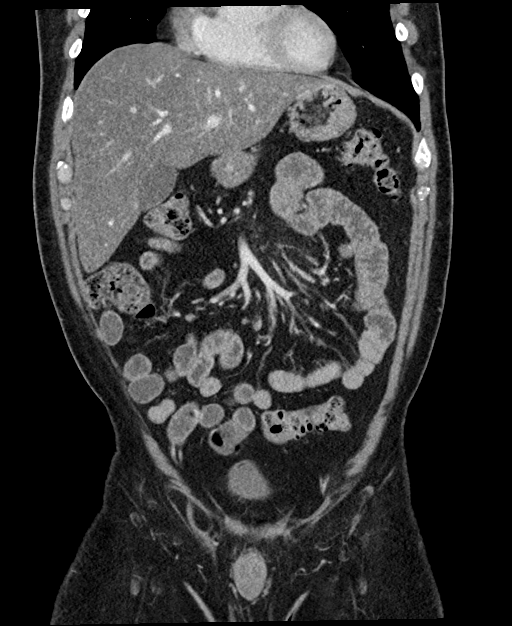
[im 43/97  soft-tissue]
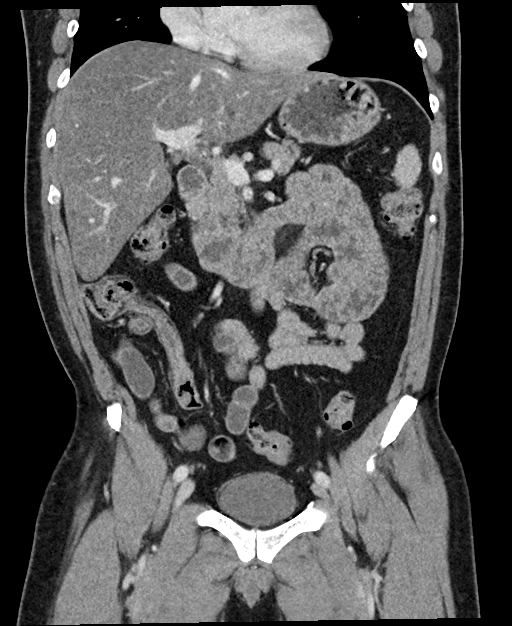
[im 54/97  soft-tissue]
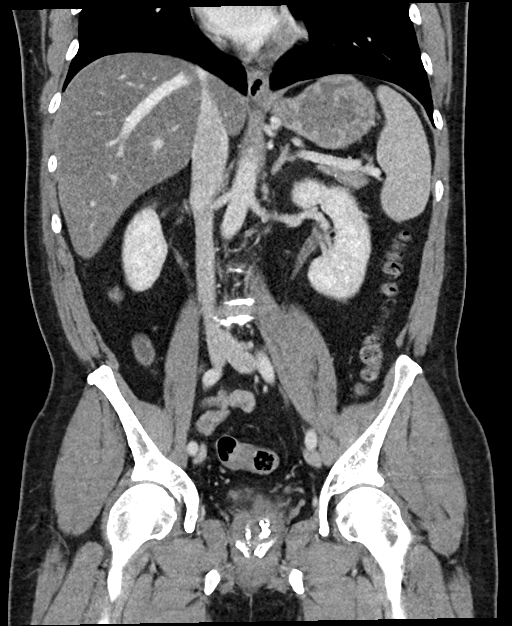

[16 of 46 positions shown; findings below may reference images not displayed]

RADIATION DOSE REDUCTION: This exam was performed according to the
departmental dose-optimization program which includes automated
exposure control, adjustment of the mA and/or kV according to
patient size and/or use of iterative reconstruction technique.

CONTRAST:  75mL OMNIPAQUE IOHEXOL 300 MG/ML  SOLN
FINDINGS: Lower chest: No acute abnormality. Fluid signal cystic lesion at the
left aspect of the aorta in the lower chest, likely a small
lymphocele, although definitively benign and minimally enlarged
compared to remote prior examination dated 6477 (series 3, image
17).

Hepatobiliary: No solid liver abnormality is seen. Severe
hypodensity of the liver parenchyma. No gallstones, gallbladder wall
thickening, or biliary dilatation.

Pancreas: Unremarkable. No pancreatic ductal dilatation or
surrounding inflammatory changes.

Spleen: Normal in size without significant abnormality.

Adrenals/Urinary Tract: Adrenal glands are unremarkable. Kidneys are
normal, without renal calculi, solid lesion, or hydronephrosis.
Bladder is unremarkable.

Stomach/Bowel: Stomach is within normal limits. Appendix appears
normal. No evidence of bowel wall thickening, distention, or
inflammatory changes. Sigmoid diverticula.

Vascular/Lymphatic: No significant vascular findings are present. No
enlarged abdominal or pelvic lymph nodes.

Reproductive: No mass or other significant abnormality.

Other: No abdominal wall hernia or abnormality. No ascites.

Musculoskeletal: No acute or significant osseous findings.
IMPRESSION: 1. No definite acute CT findings of the abdomen or pelvis to explain
periumbilical pain.
2. Severe hypodensity of the liver parenchyma, which may reflect
steatosis or alternately edema in the setting of acute hepatitis.
3. Sigmoid diverticulosis without evidence of acute diverticulitis.

## 2022-04-25 IMAGING — US US ABDOMEN LIMITED
1 series · 14 of 25 positions shown · non-contrast
Comparison: 10/11/2009

CLINICAL DATA: Abdominal pain.

EXAM:
ULTRASOUND ABDOMEN LIMITED RIGHT UPPER QUADRANT

[Series 1: us abdomen limited ruq (liver/gb) · 14 of 51 slices shown]
[im 1/51]
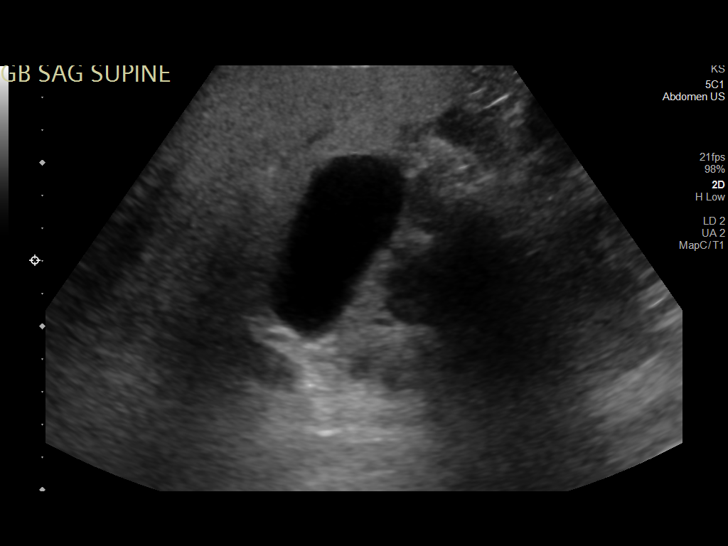
[im 5/51]
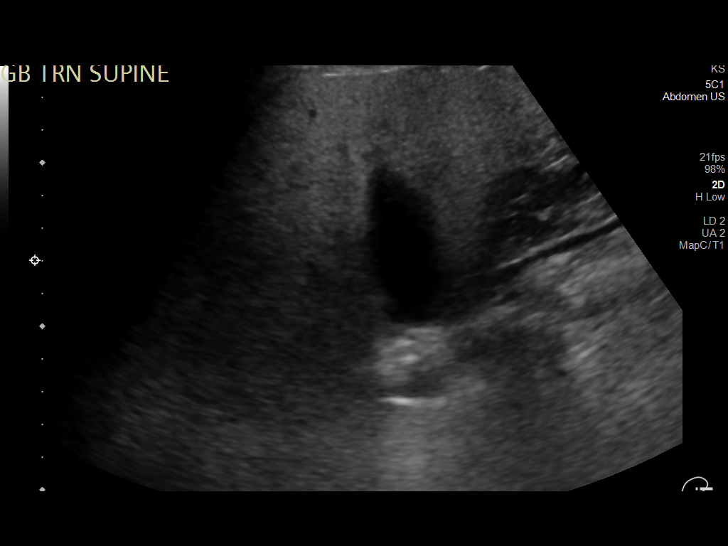
[im 9/51]
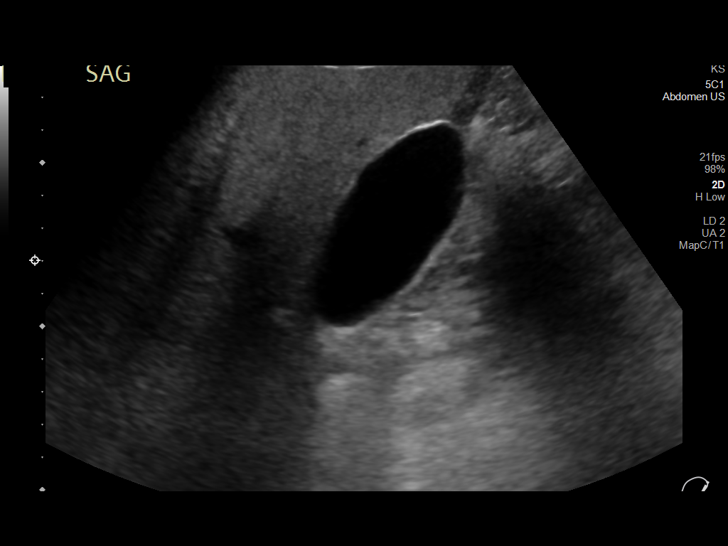
[im 13/51]
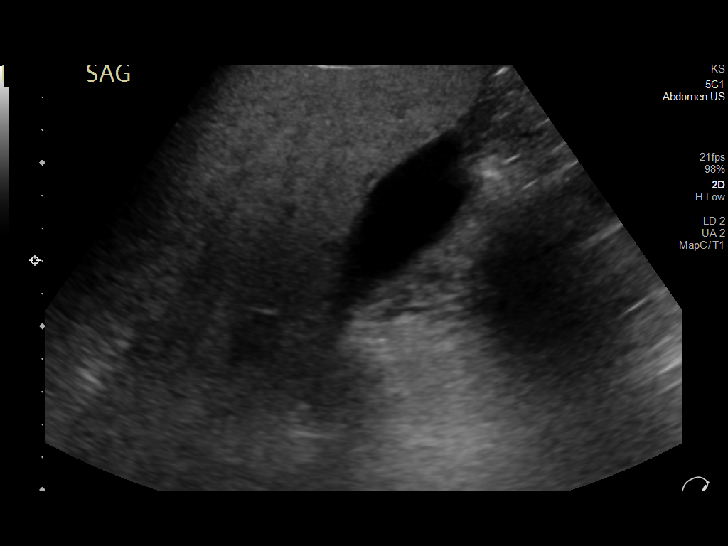
[im 17/51]
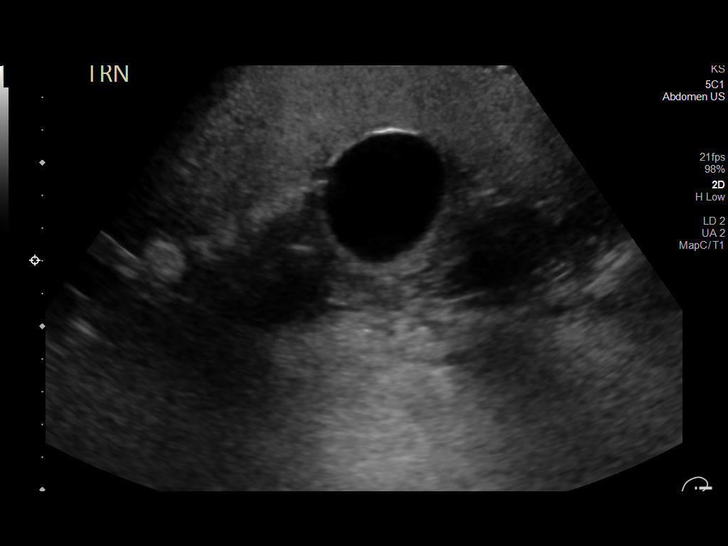
[im 19/51]
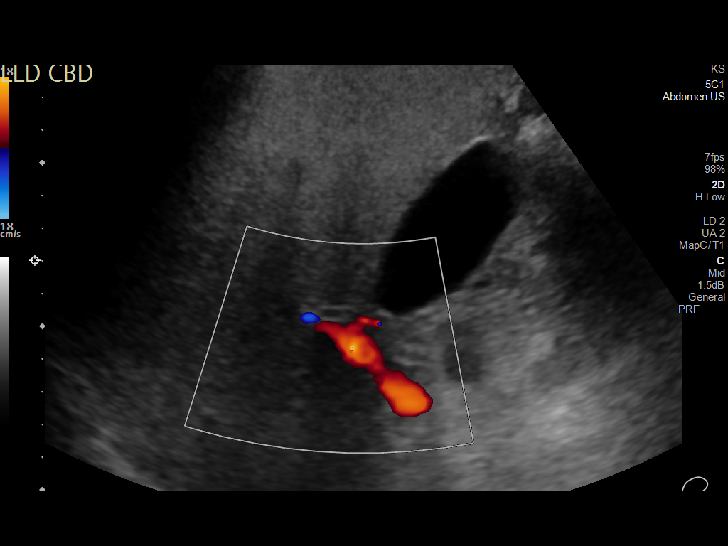
[im 23/51]
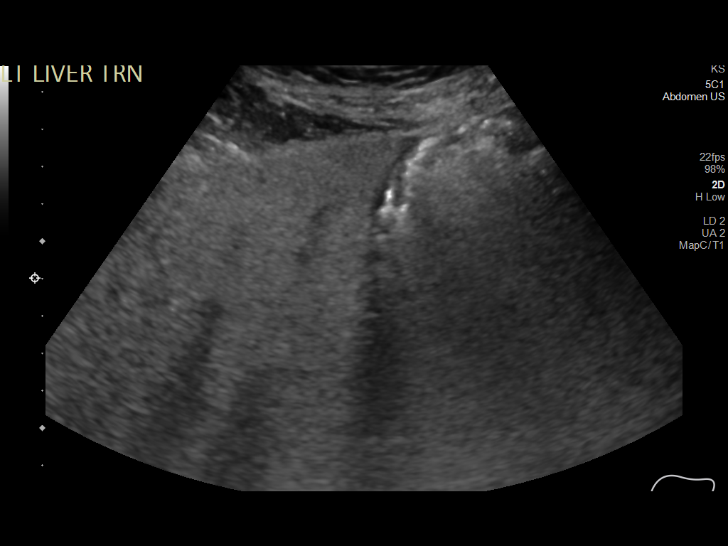
[im 28/51]
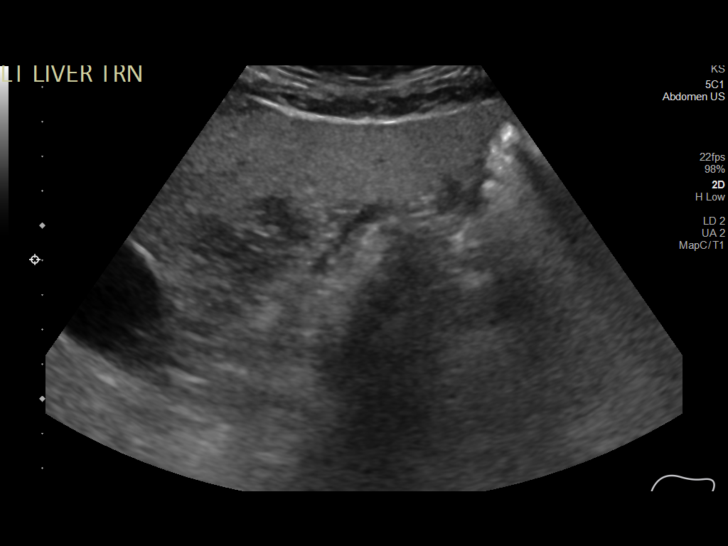
[im 32/51]
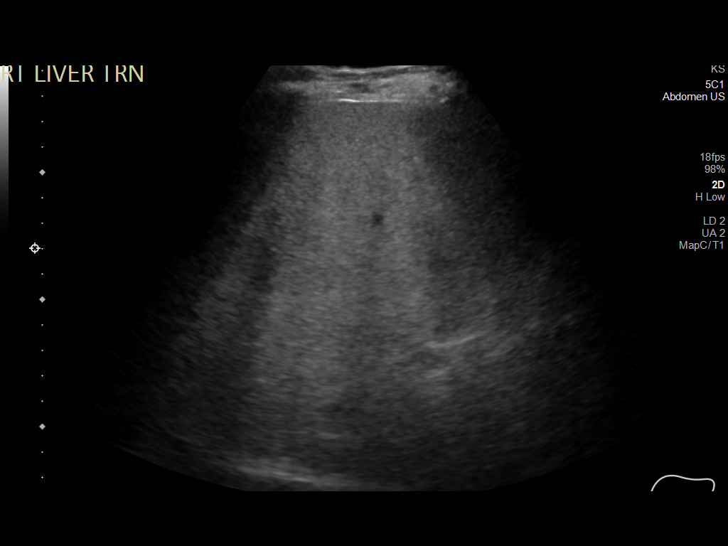
[im 34/51]
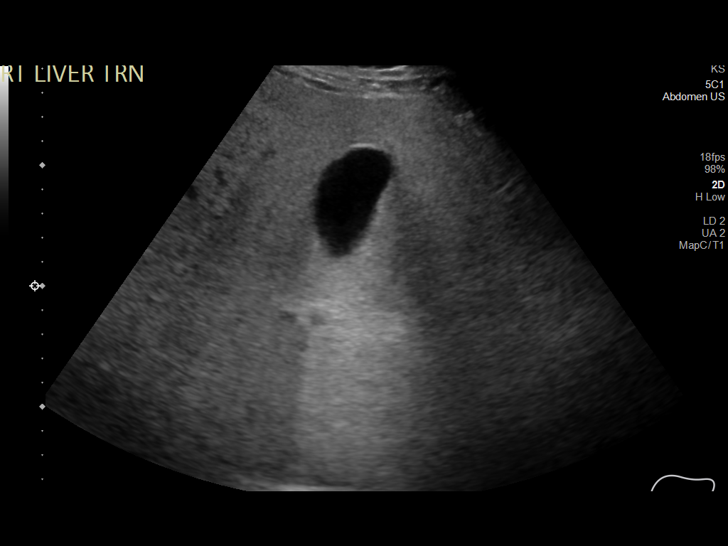
[im 38/51]
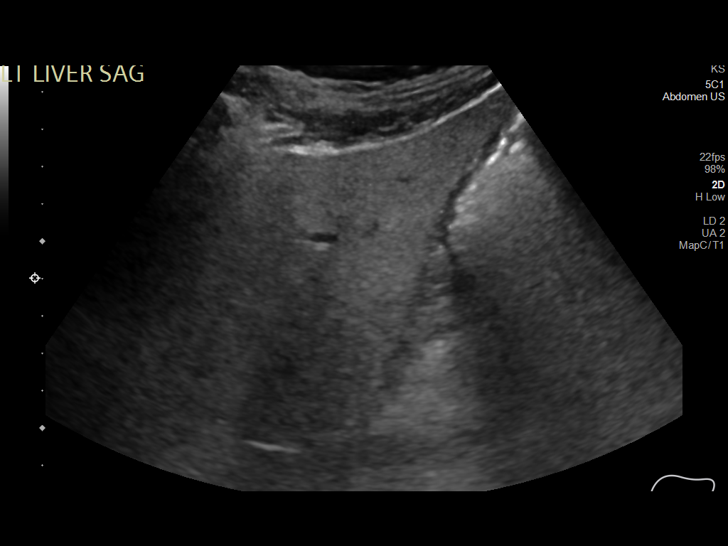
[im 42/51]
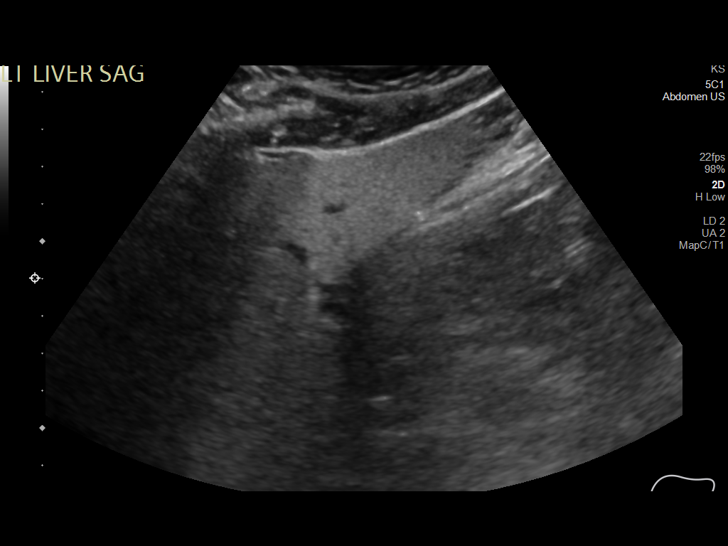
[im 46/51]
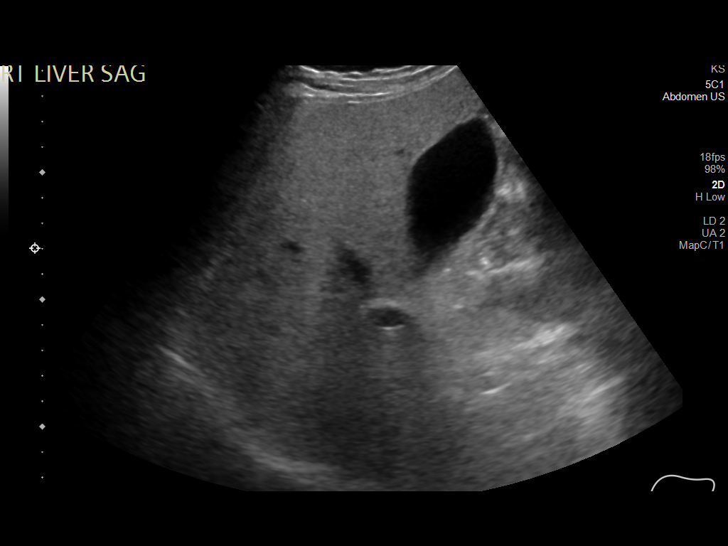
[im 51/51]
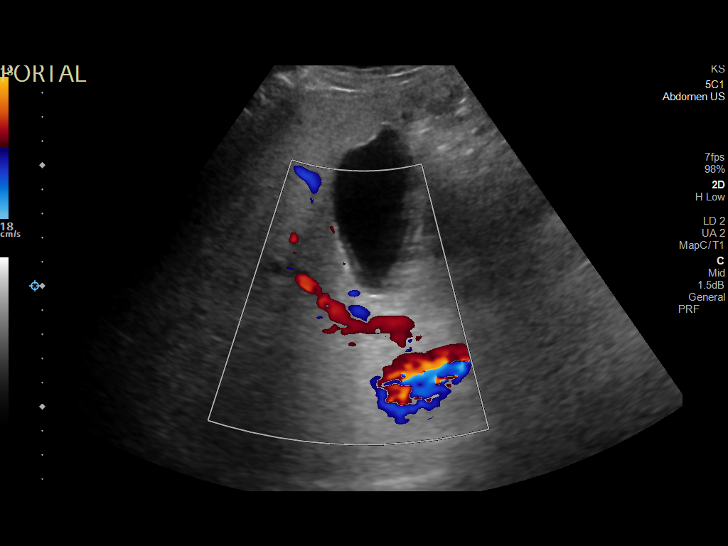

[14 of 25 positions shown; findings below may reference images not displayed]

FINDINGS: Gallbladder:

No gallstones or wall thickening visualized. No sonographic Murphy
sign noted by sonographer.

Common bile duct:

Diameter: 2 mm

Liver:

No focal lesion identified. Increased hepatic parenchymal
echogenicity. Portal vein is patent on color Doppler imaging with
normal direction of blood flow towards the liver.

Other: None.
IMPRESSION: 1. No cholelithiasis or sonographic evidence of acute cholecystitis.
2. Increased hepatic echogenicity as can be seen with hepatic
steatosis.

## 2022-05-07 DIAGNOSIS — H40003 Preglaucoma, unspecified, bilateral: Secondary | ICD-10-CM | POA: Diagnosis not present

## 2022-05-15 DIAGNOSIS — M25562 Pain in left knee: Secondary | ICD-10-CM | POA: Diagnosis not present

## 2022-05-17 IMAGING — DX DG CERVICAL SPINE COMPLETE 4+V
5 series · 5 of 5 positions shown · non-contrast
Comparison: None.

CLINICAL DATA: Neck injury

EXAM:
CERVICAL SPINE - COMPLETE 4+ VIEW

[c-spine lat]
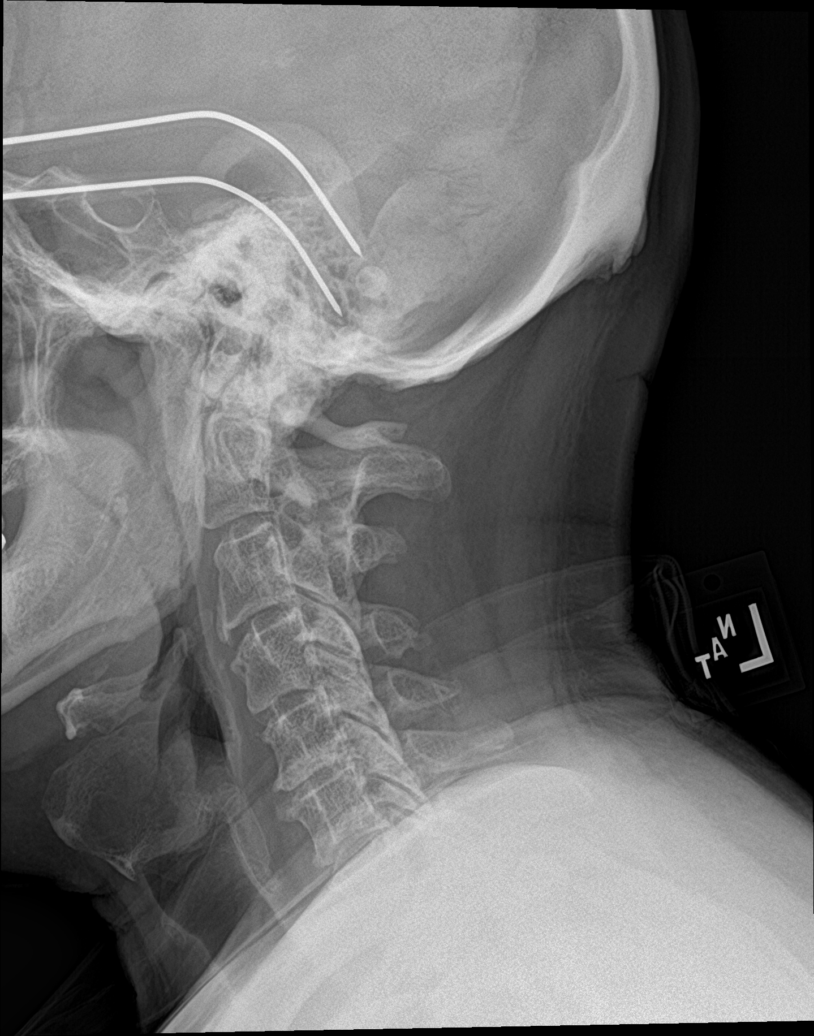

[c-spine obl (1 of 2)]
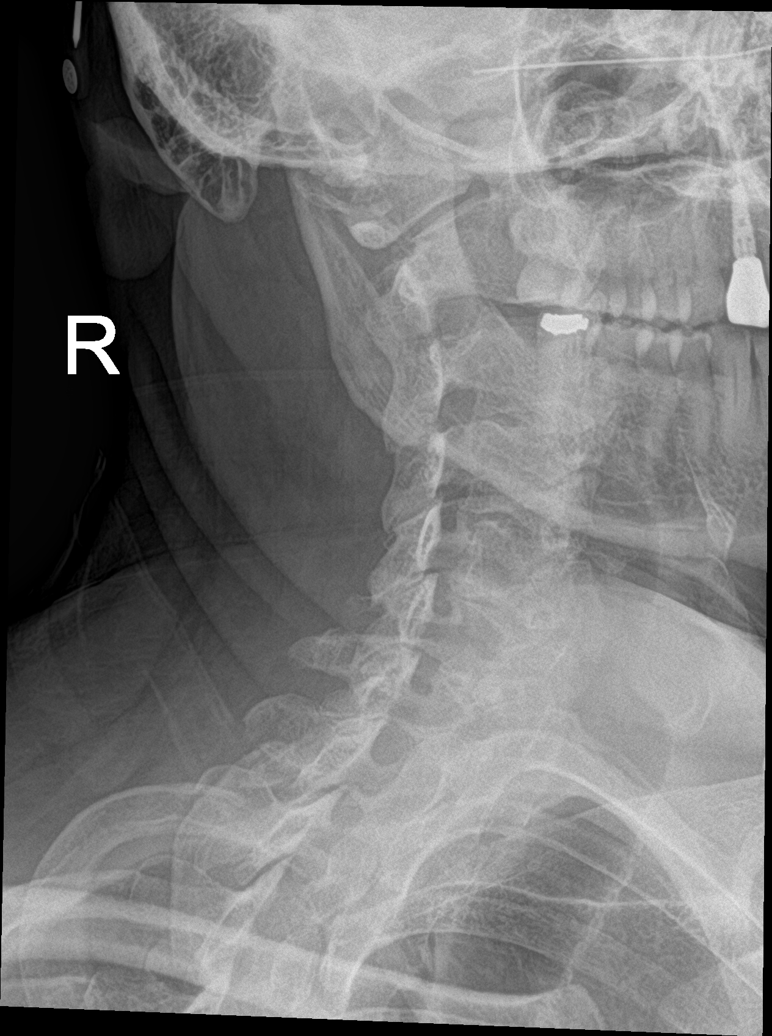

[c-spine obl (2 of 2)]
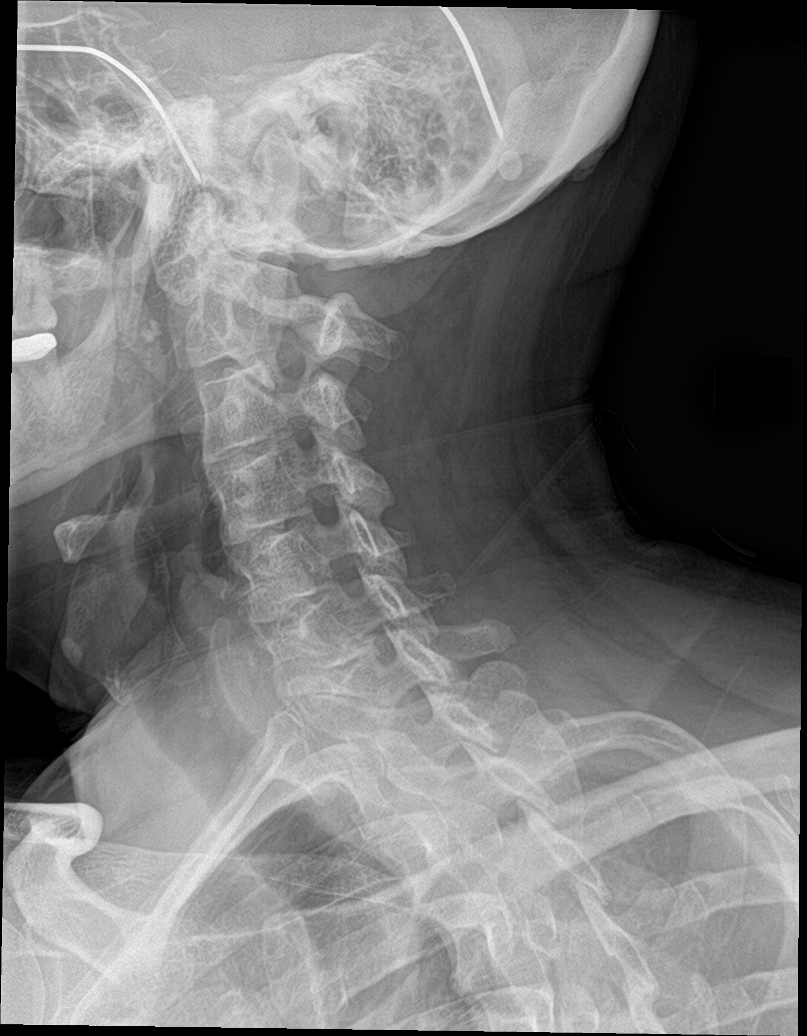

[c-spine ap]
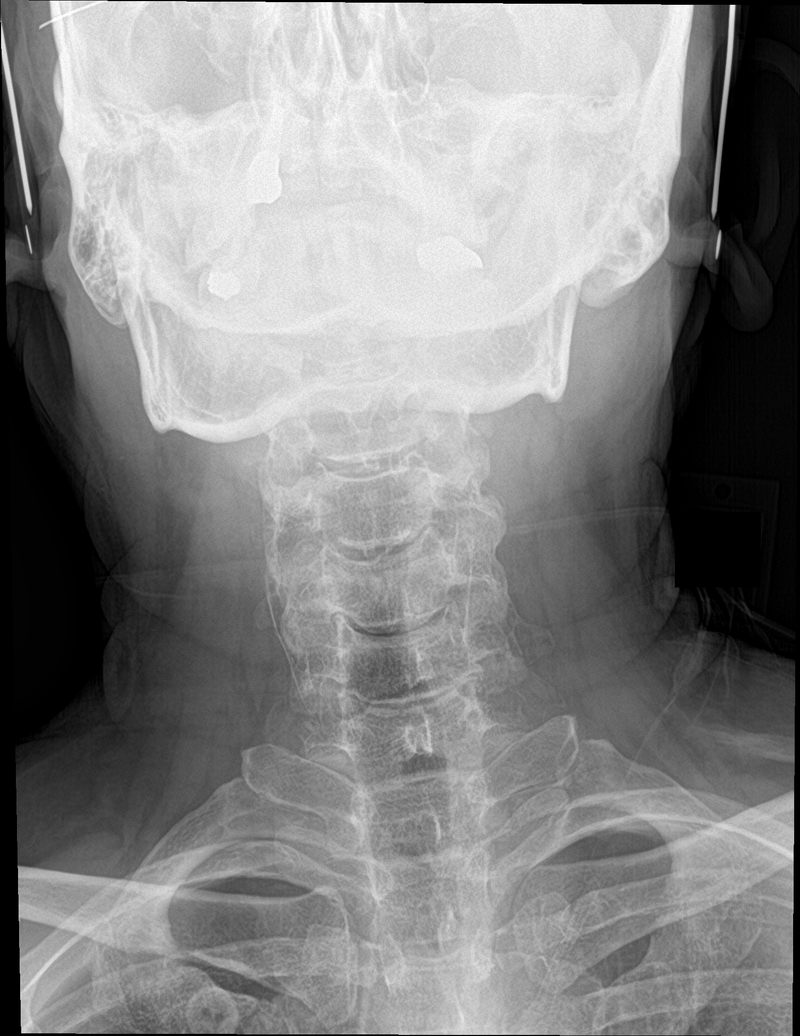

[c-spine swimmers]
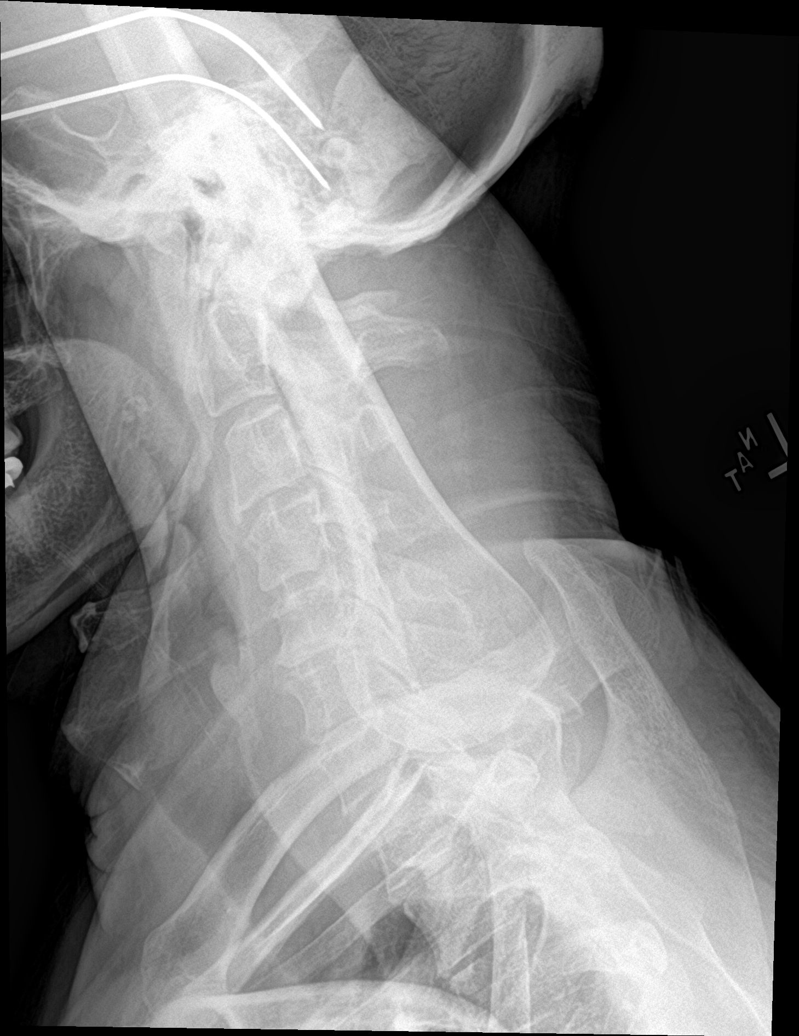

[5 of 5 positions shown; findings below may reference images not displayed]

FINDINGS: No acute fracture or malalignment identified. No prevertebral soft
tissue swelling. Mild intervertebral disc space narrowing and levels
of mild neural foraminal narrowing identified bilaterally.
IMPRESSION: No acute fracture or malalignment identified.

## 2022-06-11 DIAGNOSIS — L821 Other seborrheic keratosis: Secondary | ICD-10-CM | POA: Diagnosis not present

## 2022-06-11 DIAGNOSIS — L570 Actinic keratosis: Secondary | ICD-10-CM | POA: Diagnosis not present

## 2022-06-11 DIAGNOSIS — M7981 Nontraumatic hematoma of soft tissue: Secondary | ICD-10-CM | POA: Diagnosis not present

## 2022-08-23 ENCOUNTER — Ambulatory Visit (HOSPITAL_COMMUNITY)
Admission: EM | Admit: 2022-08-23 | Discharge: 2022-08-23 | Disposition: A | Discharge status: Home / Self Care | Payer: BC Managed Care – PPO

## 2022-08-23 DIAGNOSIS — F1010 Alcohol abuse, uncomplicated: Secondary | ICD-10-CM

## 2022-08-23 NOTE — Discharge Instructions (Addendum)
Substance Abuse Treatment Programs ° °Intensive Outpatient Programs °[facility]     °[address]      °[place], [place]                   °[phone]      ° °[facility] °[address] °[place], [place] °[phone] ° °[facility]     °(Inpatient and outpatient)     °[address]           °[phone]   ° °[facility] °336-288-1484 (Suboxone and Methadone) ° °[address]      °[place], [place] [address]      °336-882-2125      ° °[address] °[place], [place] °852-3033 ° °[facility] (Outpatient/Inpatient, [facility])    °(insurance only) [phone]      °       °[facility] (Groups & Residential) °[place], [place] °336-389-1413 ° °   °[facility]     °[address]     °[place], [place]      °336-389-1413      ° °[facility] (for caregivers and family) °[address] °[place], [place] °336-299-4655 ° ° ° ° ° °Residential Treatment Programs °[facility]      °[address], [place], [place] [address]  °(336) [phone]      ° °[facility] °[address] [place], [place] [address] °[phone] ° °[facility]        °336-248-8914      ° °[facility] °1-800-659-3381 ° °[facility]             °[address]                                         °[place], [place]                                                °877-615-2722 or 336-784-9470                              ° °[facility] [place] °[address] °[place], [address] °1.877.941.8954 ° °[facility]    °[address]      °[place], [place]     °336-273-5306      ° °[facility] °[address] °[place], [place] °336-285-9073 ° °[facility]   °[address]     °[place], [place] [address]     °336-899-1550      °Admissions: 8am-3pm M-F ° °[facility] °[address] °[place],  [place] °336-227-7417 ° °[facility]: Residential Program (90 Days)   °[place], [place]      °336-725-8389 or 800-758-6077    ° °[facility]: [facility] °[place], [place] °(Walk in Hours over the weekend or by referral) ° °[facility] °[address], [place], [place] [address] °(336) [phone] ° °Crisis Mobile: [facility]:  1-877-626-1772 (for crisis response 24 hours a day) °[facility] Hotline:      1-800-256-2452 °Outpatient Psychiatry and Counseling ° °[facility]: Mobile Crisis   Management 24 hours:  1-877-626-1772 ° °[facility] sliding scale fee and walk in schedule: M-F 8am-12pm/1pm-3pm °[address]  °[place], [place] [address] °336-387-6161 ° °[facility] °[address] °[place], [place] [address] °336-703-9650 ° °[facility] ([facility])- new patient walk-in appointments available Monday - Friday 8am -3pm.          °[address] °[place], [place] [address] °336-676-6840 or crisis line- 336-676-6905 ° °[facility]/ Intensive Outpatient Therapy Program °[address] °[place], [place] [address] °336-832-9804 ° °[facility]                  °Crisis Services      °336.641.4993      °[address]     °[place], [place] [address]                ° °[facility]   °[facility] °[phone] °[address] °[place], [place] [address] ° ° °[facility]          °[address],  °[place], [place] [address]       °(336) [phone] ° °[facility] °[address] °[place], [place] [address] °336-292-1510 ° °[facility]    °[address]    °[place], [place] [address]     °336-632-3505      ° °[facility]     °[address]     °[place] [address]     °336-282-1251     °  °[facility] °[address] °[place] [address] ° °[facility]     °[address]     °[place], [place]      °336-542-2076      ° °[facility] °Shamsher Ahluwalia, MD °[address] °[place], [place] [address] °336-420-9558 ° °[facility]     °[address]     °[place], [place] [address]     °336-274-1237      ° °[facility] °[address] °[place], [place] [address] °336-854-4450 °Resources for Temporary Residential Assistance/Crisis Centers ° °DAY CENTERS °[facility] °M-F 8am-3pm   °[address] [place], [place] [address]   336-332-0824 °Services include: laundry, barbering, support groups, case management, phone  & computer access, showers, AA/NA mtgs, mental health/substance abuse nurse, job skills class, disability information, VA assistance, spiritual classes, etc.  ° °HOMELESS SHELTERS ° °[facility]     °[facility]   °[address], [place]     °336.271.5959       °       °[facility]       °[address] °[place], [place] [address] °336-275-0820 °[email or web address] for application and process °Application Required ° °[facility]   °[address]    °[place] [address]     °336.886.4922       °             °[facility] °[address] °[place], [place] [address] °336.273.5572 °336-235-0363(schedule application appt.) °Application Required ° °[facility] (women only)    °[address]     °[place]  [address]     °336-884-1039      °  Intake starts 6pm daily °Need valid ID, SSC, & Police report °[facility] [place] °[address] °[place], [place] °336-881-5420 °Application Required ° °[facility] (men only)     °[address]      °[place], [place]     °336.748.1962      ° °[facility] °(Pregnant women only) °[address] °[place], [place] °336-275-0206 ° °[facility]      °[address]      °[place], [place] [address]     °336-722-9951      °       °[facility] °[address] °[place], [place] °336-723-1848 °90 day commitment/SA/Application process ° °[facility](men only)     °[address]     °[place], [place]     °336-748-1962       °Check-in at 7pm     °       °[facility] °[address] °[place], [place] [address] °336-248-6684 °Men/Women/Women and Children must be there by 7 pm ° °[facility] °[place], [place] °336-722-8721                ° °

## 2022-08-23 NOTE — ED Provider Notes (Signed)
Behavioral Health Urgent Care Medical Screening Exam  Patient Name: Kyle Schneider MRN: [id number] Date of Evaluation: 08/23/22 Chief Complaint:  " I need outpatient resources for alcohol use."  Diagnosis:  Final diagnoses:  Alcohol abuse    History of Present illness: Kyle Schneider is a 57 y.o. male.  Presents to [facility] urgent care seeking additional outpatient resources for substance abuse related to alcohol.  Reports he has been drinking on and off for the past 10 years.  States he drinks 1/5 of bourbon "often."  Denies any other illicit drug use.  Reports he was recently seen and evaluated by [facility] however is not interested in a residential treatment facility at this time.  States he has about 1 week available prior to starting his new job.  Would like to do intensive outpatient programming.  He denied suicidal or homicidal ideations.  Denies auditory or visual hallucinations.  He reports feeling" hung over."  Denying any withdrawal symptoms i.e. headaches nausea vomiting dizziness or tremors.  Denies a history of alcohol withdrawal seizures.   Kyle Schneider is sitting in no acute distress. He is alert/oriented x 4; calm/cooperative; and mood congruent with affect. He is speaking in a clear tone at moderate volume, and normal pace; with good eye contact.  His thought process is coherent and relevant; There is no indication that he is currently responding to internal/external stimuli or experiencing delusional thought content; and he has denied suicidal/self-harm/homicidal ideation, psychosis, and paranoia.   Patient has remained calm throughout assessment and has answered questions appropriately.     At this time Kyle Schneider is educated and verbalizes understanding of mental health resources and other crisis services in the community. He is instructed to call 911 and present to the nearest emergency room should he experience any suicidal/homicidal ideation,  auditory/visual/hallucinations, or detrimental worsening of his mental health condition. HE was a also advised by Clinical research associate that he could call the toll-free phone on insurance card to assist with identifying in network counselors and agencies or number on back of Medicaid card t speak with care coordinator   Flowsheet Row ED from 07/07/2021 in [facility] ED from 06/15/2021 in [facility] Emergency Department at [facility]  C-SSRS RISK CATEGORY No Risk No Risk       Psychiatric Specialty Exam  Presentation  General Appearance:Appropriate for Environment  Eye Contact:Good  Speech:Clear and Coherent  Speech Volume:Normal  Handedness:Right   Mood and Affect  Mood:Anxious  Affect:Congruent   Thought Process  Thought Processes:Coherent  Descriptions of Associations:Intact  Orientation:Full (Time, Place and Person)  Thought Content:Logical    Hallucinations:None  Ideas of Reference:None  Suicidal Thoughts:No  Homicidal Thoughts:No   Sensorium  Memory:Remote Good; Immediate Good  Judgment:Good  Insight:Good   Executive Functions  Concentration:Fair  Attention Span:Good  Recall:Good  Fund of Knowledge:Good  Language:Good   Psychomotor Activity  Psychomotor Activity:Normal   Assets  Assets:Desire for Improvement   Sleep  Sleep:Fair  Number of hours: No data recorded  Physical Exam: Physical Exam Vitals and nursing note reviewed.  Constitutional:      Appearance: Normal appearance.  Cardiovascular:     Rate and Rhythm: Normal rate and regular rhythm.  Pulmonary:     Effort: Pulmonary effort is normal.     Breath sounds: Normal breath sounds.  Neurological:     General: No focal deficit present.     Mental Status: He is alert and oriented to person, place, and time.  Psychiatric:        Mood and Affect: Mood normal.        Behavior: Behavior normal.        Thought Content: Thought content normal.     Review of Systems  Psychiatric/Behavioral:  Positive for substance abuse. Negative for depression and suicidal ideas. The patient is not nervous/anxious.   All other systems reviewed and are negative.  Blood pressure 131/88, pulse 77, temperature 98.1 F (36.7 C), temperature source Oral, resp. rate 18, SpO2 98 %. There is no height or weight on file to calculate BMI.  Musculoskeletal: Strength & Muscle Tone: within normal limits Gait & Station: normal Patient leans: N/A   [facility] MSE Discharge Disposition for Follow up and Recommendations: Based on my evaluation the patient does not appear to have an emergency medical condition and can be discharged with resources and follow up care in outpatient services for Substance Abuse Intensive Outpatient Program  - Referral to Hessie Diener with [facility]  Oneta Rack, NP 08/23/2022, 10:38 AM

## 2022-08-23 NOTE — Progress Notes (Signed)
   08/23/22 1001  [facility] Triage Screening (Walk-ins at [facility] only)  How Did You Hear About [place]? Self  What Is the Reason for Your Visit/Call Today? Patient is a 57 year old male that presents this date requesting resources for ongoing EtOH problems. Patient denies any S/I, H/I or AVH. Patient denies any previous mental health history. Patient denies any previous attempts/gestures at self harm. Patient states he consumes 6 to 12 beers per setting with last use in the last 24 hours when patient reported he "drank a few beers." Patient reports he various amounts of alcohol 4 to 5 times a week for the last year and now feels his excessive drinking has become problematic. Patient denies any other SA issues.  How Long Has This Been Causing You Problems? > than 6 months  Have You Recently Had Any Thoughts About Hurting Yourself? No  Are You Planning to Commit Suicide/Harm Yourself At This time? No  Have you Recently Had Thoughts About Hurting Someone Karolee Ohs? No  Are You Planning To Harm Someone At This Time? No  Are you currently experiencing any auditory, visual or other hallucinations? No  Have You Used Any Alcohol or Drugs in the Past 24 Hours? Yes  How long ago did you use Drugs or Alcohol? Patient states he "had a few beers" in the last 24 hours  What Did You Use and How Much? Patient states he "had a few beers" in the last 24 hours  Do you have any current medical co-morbidities that require immediate attention? No  Clinician description of patient physical appearance/behavior: Patient presents with a pleasant affect  What Do You Feel Would Help You the Most Today? Alcohol or Drug Use Treatment  If access to [facility] was not available, would you have sought care in the Emergency Department? No  Determination of Need Routine (7 days)  Options For Referral Outpatient Therapy

## 2022-08-26 ENCOUNTER — Telehealth (HOSPITAL_COMMUNITY): Payer: Self-pay | Admitting: Licensed Clinical Social Worker

## 2022-08-26 NOTE — Telephone Encounter (Signed)
The therapist attempts to reach Michele about CD IOP at the request of the NP at the [facility]. The therapist leaves a HIPAA-compliant voicemail with his direct callback number.  Myrna Blazer, MA, LCSW, [facility], LCAS 08/26/2022

## 2022-08-27 DIAGNOSIS — S40862A Insect bite (nonvenomous) of left upper arm, initial encounter: Secondary | ICD-10-CM | POA: Diagnosis not present

## 2022-09-17 DIAGNOSIS — E119 Type 2 diabetes mellitus without complications: Secondary | ICD-10-CM | POA: Diagnosis not present

## 2022-09-17 DIAGNOSIS — K760 Fatty (change of) liver, not elsewhere classified: Secondary | ICD-10-CM | POA: Diagnosis not present

## 2022-09-17 DIAGNOSIS — I10 Essential (primary) hypertension: Secondary | ICD-10-CM | POA: Diagnosis not present

## 2022-09-17 DIAGNOSIS — E7800 Pure hypercholesterolemia, unspecified: Secondary | ICD-10-CM | POA: Diagnosis not present

## 2022-09-17 DIAGNOSIS — Z79899 Other long term (current) drug therapy: Secondary | ICD-10-CM | POA: Diagnosis not present

## 2022-09-17 DIAGNOSIS — Z0000 Encounter for general adult medical examination without abnormal findings: Secondary | ICD-10-CM | POA: Diagnosis not present

## 2022-09-23 DIAGNOSIS — Z125 Encounter for screening for malignant neoplasm of prostate: Secondary | ICD-10-CM | POA: Diagnosis not present

## 2022-11-25 DIAGNOSIS — E119 Type 2 diabetes mellitus without complications: Secondary | ICD-10-CM | POA: Diagnosis not present

## 2023-05-08 LAB — HM DIABETES EYE EXAM

## 2023-05-23 LAB — LAB REPORT - SCANNED
EGFR: 101
HM HIV Screening: NEGATIVE
HM Hepatitis Screen: NEGATIVE

## 2023-06-11 ENCOUNTER — Encounter: Payer: Self-pay | Admitting: *Deleted

## 2023-06-14 ENCOUNTER — Encounter: Payer: Self-pay | Admitting: Family Medicine

## 2023-06-14 NOTE — Progress Notes (Deleted)
 No chief complaint on file.  Patient presents to establish care. Last note from [facility] internal medicine (Dr. Orson Aloe), from 02/2023 was visible in Epic, but prior visits (including CPE 09/2022) cannot be viewed.  Diabetes: He is on metformin. Last A1c 7.1 in 02/2023, prior had been 6.8 in 11/2022.  He had decreased metformin from twice daily to once daily, due to some hypoglycemia. In November, it had been noted that he had gained weight, and was drinking more alcohol, related to stressful event (loss of job).  Meds changed? Or time/improved diet/exercise??  Hypertension:  he is compliant with amlodipine 5mg  and olmesartan 20mg . BP's are not checked elsewhere, but was normal at 02/2023 doctor visit. He tries to follow low sodium diet.  Exercise--  BPH with urinary frequency, and ED:  he takes low dose daily cialis, 2.5mg   GERD:  He takes Nexium daily and denies symptoms. No dysphagia or chest pain.  Hyperlipidemia:  he is compliant with rosuvastatin, denies side effects. Tries to follow a low cholesterol diet.  Hepatic steatosis (noted in chart as NAFLD, but there is a component d/t alcohol likely). Last LFT's through [facility] were mildly elevated, due for recheck. He had [place] 06/2021: IMPRESSION: 1. No cholelithiasis or sonographic evidence of acute cholecystitis. 2. Increased hepatic echogenicity as can be seen with hepatic steatosis.  CT 06/2021: periumbilical pain. 2. Severe hypodensity of the liver parenchyma, which may reflect steatosis or alternately edema in the setting of acute hepatitis. 3. Sigmoid diverticulosis without evidence of acute diverticulitis   Alcohol UC visit 08/2022 for alcohol abuse (note not viewed, didn't break-the-glass) 03/2022 ER visit for intoxication/AMS. MRI done at that time-- IMPRESSION: 1. No acute intracranial abnormality. 2. Mildly advanced cerebellar atrophy for age.  (Also had normal head CT, CT angio head and neck--code stroke had been  called).  Per 02/2023 [facility] note, usual ETOH 6-8/wk, had been up to 12-14/wk due to stress (loss of job the month prior).   Other doctors he sees:  Derm: Dr. Donzetta Starch GI: Dr. Jennye Boroughs    Health maintenance:  TdaP given 03/06/23 Reportedly got shingrix. Reportedly had pneumovax 23 in 06/2018 from PCP prior to [facility] (not confirmed, unsure if he has records or is in [place]) Colonoscopy with Dr. Jennye Boroughs (?) 3-4 years ago??    PMH, PSH, SH reviewed   ROS:    PHYSICAL EXAM:  There were no vitals taken for this visit.  Wt 183# in 02/2023 at [facility] Readings from Last 3 Encounters:  04/08/22 180 lb (81.6 kg)  06/15/21 173 lb (78.5 kg)  06/06/20 179 lb (81.2 kg)       ASSESSMENT/PLAN:  Did we ever get any records on him????? If so, I don't believe they were ever given to me If not, needs to sign release (for [facility], and for prior PCP--Dr. Shary Decamp?, GI for colonoscopy)   A1c Due for LFT's (vs c-met?)  Please get vaccines from [facility]. 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 I know he got TdaP 03/06/23 from [place]. Reportedly got shingrix. Reportedly had pneumovax 23 in 06/2018 from PCP prior to [facility] (not confirmed, unsure if he has records or is in [place]) Colonoscopy with Dr. Jennye Boroughs (?) 3-4 years ago??  Need records   Don't know when last microalb was PSA likely June with CPE  CPE after mid-June or July

## 2023-06-15 ENCOUNTER — Telehealth: Payer: Self-pay | Admitting: *Deleted

## 2023-06-15 ENCOUNTER — Ambulatory Visit: Payer: Self-pay | Admitting: Family Medicine

## 2023-06-15 DIAGNOSIS — R7989 Other specified abnormal findings of blood chemistry: Secondary | ICD-10-CM

## 2023-06-15 DIAGNOSIS — N401 Enlarged prostate with lower urinary tract symptoms: Secondary | ICD-10-CM

## 2023-06-15 DIAGNOSIS — E119 Type 2 diabetes mellitus without complications: Secondary | ICD-10-CM

## 2023-06-15 DIAGNOSIS — I10 Essential (primary) hypertension: Secondary | ICD-10-CM

## 2023-06-15 DIAGNOSIS — K760 Fatty (change of) liver, not elsewhere classified: Secondary | ICD-10-CM

## 2023-06-15 DIAGNOSIS — E7800 Pure hypercholesterolemia, unspecified: Secondary | ICD-10-CM

## 2023-06-15 DIAGNOSIS — K219 Gastro-esophageal reflux disease without esophagitis: Secondary | ICD-10-CM

## 2023-06-15 NOTE — Telephone Encounter (Signed)
 Pt contacted the call center wanting to reschedule his new patient appointment with you. They did not provide a reason. Please advise.

## 2023-06-15 NOTE — Telephone Encounter (Signed)
 For New patients we do not charge No Show fee, but we do not reschedule the patient and that is marked on the appt desk

## 2023-06-15 NOTE — Telephone Encounter (Signed)
 Dr. Lynelle Doctor said he needs to be charged for no show and get a letter, thanks.

## 2023-07-19 NOTE — Progress Notes (Unsigned)
 No chief complaint on file.  Patient presents to establish care. He missed his establish care visit last month, as he was in rehab. Last note from [facility] internal medicine (Dr. Orson Aloe), from 02/2023 was visible in Epic, but prior visits (including CPE 09/2022) cannot be viewed.   Alcohol  UC visit 08/2022 for alcohol abuse (note not viewed, didn't break-the-glass) 03/2022 ER visit for intoxication/AMS. MRI done at that time-- IMPRESSION: 1. No acute intracranial abnormality. 2. Mildly advanced cerebellar atrophy for age.  (Also had normal head CT, CT angio head and neck--code stroke had been called).   Per 02/2023 [facility] note, usual ETOH 6-8/wk, had been up to 12-14/wk due to stress (loss of job the month prior).   Diabetes: He is on metformin. Last A1c 7.1 in 02/2023, prior had been 6.8 in 11/2022.  He had decreased metformin from twice daily to once daily, due to some hypoglycemia. In November, it had been noted that he had gained weight, and was drinking more alcohol, related to stressful event (loss of job).   Meds changed? Or time/improved diet/exercise??   Hypertension:  he is compliant with amlodipine 5mg  and olmesartan 20mg . BP's are not checked elsewhere, but was normal at 02/2023 doctor visit. He tries to follow low sodium diet.   Exercise--   BPH with urinary frequency, and ED:  he takes low dose daily cialis, 2.5mg    GERD:  He takes Nexium daily and denies symptoms. No dysphagia or chest pain.   Hyperlipidemia:  he is compliant with rosuvastatin, denies side effects. Tries to follow a low cholesterol diet.   Hepatic steatosis (noted in chart as NAFLD, but there is a component d/t alcohol likely). Last LFT's through [facility] were mildly elevated, due for recheck. He had [place] 06/2021: IMPRESSION: 1. No cholelithiasis or sonographic evidence of acute cholecystitis. 2. Increased hepatic echogenicity as can be seen with hepatic steatosis.   CT 06/2021: periumbilical  pain. 2. Severe hypodensity of the liver parenchyma, which may reflect steatosis or alternately edema in the setting of acute hepatitis. 3. Sigmoid diverticulosis without evidence of acute diverticulitis       Other doctors he sees:   Derm: Dr. Donzetta Starch GI: Dr. Jennye Boroughs       Health maintenance:   TdaP given 03/06/23 Reportedly got shingrix. Reportedly had pneumovax 23 in 06/2018 from PCP prior to [facility] (not confirmed, unsure if he has records or is in [place]) Colonoscopy with Dr. Jennye Boroughs (?) 3-4 years ago??       PMH, PSH, SH reviewed     ROS:       PHYSICAL EXAM:   There were no vitals taken for this visit.   Wt 183# in 02/2023 at [facility] Readings from Last 3 Encounters:  04/08/22 180 lb (81.6 kg)  06/15/21 173 lb (78.5 kg)  06/06/20 179 lb (81.2 kg)           ASSESSMENT/PLAN:    I believe he recently signed ROR--I have no records (need from [place], and prior PCP, Dr. Shary Decamp? And GI for colonoscopy, Dr. Jennye Boroughs, 3-4 years ago??). Ensure he signed to get all these records, and if not, have him do today.     A1c TODAY  Due for LFT's (vs c-met?)   Please get vaccines from [facility]. 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 I know he got TdaP 03/06/23 from [place]. There are vaccines to be reconciled in chart. I only see 1 shingrix--did he get  2nd?     Don't know when last microalb was--will wait for records or do at physical PSA likely June with CPE   CPE after mid-June or July

## 2023-07-20 ENCOUNTER — Encounter: Payer: Self-pay | Admitting: Family Medicine

## 2023-07-20 ENCOUNTER — Ambulatory Visit (INDEPENDENT_AMBULATORY_CARE_PROVIDER_SITE_OTHER): Payer: Self-pay | Admitting: Family Medicine

## 2023-07-20 VITALS — BP 120/80 | HR 71 | Ht 67.0 in | Wt 177.8 lb

## 2023-07-20 DIAGNOSIS — N401 Enlarged prostate with lower urinary tract symptoms: Secondary | ICD-10-CM

## 2023-07-20 DIAGNOSIS — R350 Frequency of micturition: Secondary | ICD-10-CM

## 2023-07-20 DIAGNOSIS — E782 Mixed hyperlipidemia: Secondary | ICD-10-CM

## 2023-07-20 DIAGNOSIS — E119 Type 2 diabetes mellitus without complications: Secondary | ICD-10-CM

## 2023-07-20 DIAGNOSIS — R7989 Other specified abnormal findings of blood chemistry: Secondary | ICD-10-CM

## 2023-07-20 DIAGNOSIS — I10 Essential (primary) hypertension: Secondary | ICD-10-CM | POA: Diagnosis not present

## 2023-07-20 DIAGNOSIS — K219 Gastro-esophageal reflux disease without esophagitis: Secondary | ICD-10-CM | POA: Diagnosis not present

## 2023-07-20 DIAGNOSIS — K760 Fatty (change of) liver, not elsewhere classified: Secondary | ICD-10-CM

## 2023-07-20 DIAGNOSIS — F1090 Alcohol use, unspecified, uncomplicated: Secondary | ICD-10-CM

## 2023-07-20 LAB — POCT GLYCOSYLATED HEMOGLOBIN (HGB A1C): Hemoglobin A1C: 8.2 % — AB (ref 4.0–5.6)

## 2023-07-20 NOTE — Patient Instructions (Addendum)
 Let me know which CGM is covered by your insurance, and I can send in a prescription. (Dexcom or [employer], and which versions).  We aren't changing the metformin dose yet--please check sugars regularly and contact us if they are high (>140 for fasting, or >170 for postprandial) and we can increase the dose to 1000 mg twice daily.

## 2023-07-21 ENCOUNTER — Encounter: Payer: Self-pay | Admitting: Family Medicine

## 2023-07-21 LAB — GAMMA GT: GGT: 36 IU/L (ref 0–65)

## 2023-07-21 LAB — HEPATIC FUNCTION PANEL
ALT: 33 IU/L (ref 0–44)
AST: 23 IU/L (ref 0–40)
Albumin: 4.8 g/dL (ref 3.8–4.9)
Alkaline Phosphatase: 52 IU/L (ref 44–121)
Bilirubin Total: 0.3 mg/dL (ref 0.0–1.2)
Bilirubin, Direct: 0.11 mg/dL (ref 0.00–0.40)
Total Protein: 7.1 g/dL (ref 6.0–8.5)

## 2023-07-28 ENCOUNTER — Encounter: Payer: Self-pay | Admitting: Family Medicine

## 2023-07-28 DIAGNOSIS — K449 Diaphragmatic hernia without obstruction or gangrene: Secondary | ICD-10-CM | POA: Insufficient documentation

## 2023-07-28 DIAGNOSIS — K635 Polyp of colon: Secondary | ICD-10-CM | POA: Insufficient documentation

## 2023-08-17 ENCOUNTER — Telehealth: Payer: Self-pay

## 2023-08-17 MED ORDER — METFORMIN HCL ER 500 MG PO TB24: 500.0000 mg | ORAL_TABLET | Freq: Two times a day (BID) | ORAL | 0 refills | Status: DC

## 2023-08-17 MED ORDER — ESOMEPRAZOLE MAGNESIUM 40 MG PO CPDR: 40.0000 mg | DELAYED_RELEASE_CAPSULE | Freq: Every day | ORAL | 0 refills | Status: DC

## 2023-08-17 MED ORDER — ROSUVASTATIN CALCIUM 10 MG PO TABS: 10.0000 mg | ORAL_TABLET | Freq: Every day | ORAL | 0 refills | Status: DC

## 2023-08-17 MED ORDER — OLMESARTAN MEDOXOMIL 20 MG PO TABS: 20.0000 mg | ORAL_TABLET | Freq: Every day | ORAL | 0 refills | Status: DC

## 2023-08-17 NOTE — Telephone Encounter (Signed)
 Sent rx's.

## 2023-08-17 NOTE — Telephone Encounter (Signed)
 Ok for 90d supplies of these.  Has appt in July.

## 2023-08-17 NOTE — Telephone Encounter (Signed)
 Refill Request / [facility]  Esomeprazole Magnesium 40MG  Olmesartan Medoxomil 20 MG TAB Metformin HCL 500 MG TAB Rosuvastatin Calcium 10 MG TAB

## 2023-08-17 NOTE — Telephone Encounter (Signed)
 Are these okay to refill?

## 2023-08-26 ENCOUNTER — Telehealth: Payer: Self-pay | Admitting: Family Medicine

## 2023-08-26 MED ORDER — TADALAFIL 2.5 MG PO TABS: 2.5000 mg | ORAL_TABLET | Freq: Two times a day (BID) | ORAL | 1 refills | Status: DC

## 2023-08-26 NOTE — Telephone Encounter (Signed)
 Pts pharmacy sent a fax requesting refill on tadalafil. Pharmacy requesting: [facility] [address] [facility] - [place], [place] - [address]

## 2023-08-26 NOTE — Telephone Encounter (Signed)
 Is this okay to refill?

## 2023-10-25 NOTE — Patient Instructions (Incomplete)

## 2023-10-25 NOTE — Progress Notes (Unsigned)
 No chief complaint on file.  Kyle Schneider is a 58 y.o. male who presents for a complete physical.  He has the following concerns:    Diabetes: He had been on metformin  500 mg BID, did well and dose lowered to once daily at one point.  Sugars increased related to increasing alcohol use, with A1c of 7.6 on admission to rehab earlier this year.  His metformin  dose was increased to 750 mg BID, and he had lost 10# in rehab.  Looks like dose of metformin  requested in May for refill was 500 mg (not 750 mg.    ***  His A1c was up to 8.2% in 07/2023. He reported having more candy in rehab (bc the food was awful).  He wasn't able to exercise much while there either. He wasn't checking blood sugars.    At his April visit he was prescribed CGM. He reported that his diet improved and he was more active since being home from rehab.  Plan was for him to continue on the 750 mg BID, to monitor sugars, and if elevated, contact us  to increase metformin  to 1000 mg BID.  Today he reports  Sugars are running ***     Hypertension:  he previously took amlodipine 5mg  and olmesartan  20mg .  Amlodipine was stopped while in rehab due to low BP's, and weight loss. He continues on olmesartan .  He denies dizziness, headaches, chest pain. He tries to follow low sodium diet.  Regular exercise--Aerobic workout 3x/week x 1 hour is typical routine. Walking/cycling. No other weight-bearing. BP's have been running ***  BP Readings from Last 3 Encounters:  07/20/23 120/80  04/09/22 (!) 134/91  07/07/21 (!) 143/100      BPH with urinary frequency, and ED:  he takes low dose daily cialis , 2.5mg  BID (intolerant of 5 mg once daily). Stream is somewhat weak/dribbly--had been worse in rehab when drinking stronger coffee.  Sitting to void helps.  Up 2-3x/night. Didn't tolerate tamsulosin due to nasal congestion. Used to see urologist at [facility]. Thinks he may have tried other medications (10 years ago).    GERD:  He takes  Nexium  daily and denies symptoms. Denies dysphagia or chest pain.   Hyperlipidemia:  he is compliant with rosuvastatin , denies side effects. Tries to follow a low cholesterol diet. CT angio 07/2020--no CAD, calcium  score of 0    Hepatic steatosis (noted in chart as NAFLD, but there is a component d/t alcohol). Prior imaging was US  and CT in 06/2021.  CT also showed sigmoid diverticulosis. Last LFT's through [facility] were mildly elevated, and elevated on arrival to rehab.  LFT's were normal in 07/2023.   He has been taking fiber daily for 2-3 years. He had some bowel issues while on campral in rehab.  Denies any GI concerns currently.    Other doctors he sees: Derm: Dr. Bard Molt GI: Dr. [facility]) Ophtho: ???***     Immunization History  Administered Date(s) Administered   Fluzone Influenza virus vaccine,trivalent (IIV3), split virus 12/13/2013   Hepatitis A, Ped/Adol-2 Dose 09/12/2012   Influenza, Quadrivalent, Recombinant, Inj, Pf 12/21/2020, 01/11/2021   Pneumococcal Conjugate-13 06/08/2017   Pneumococcal Polysaccharide-23 06/15/2018   Tdap 09/22/2012, 03/06/2023   Unspecified SARS-COV-2 Vaccination 01/07/2020   Zoster Recombinant(Shingrix) 10/01/2021, 12/24/2021   Last colonoscopy:  05/2019 with Dr. Larene ()--inflamed hemorrhoids.  H/o polyps in 2016.  5 yr f/u recommended Last PSA: Dentist: Ophtho: Exercise:    PMH, PSH, SH and FH were reviewed and updated  ROS: no fever, chills, HA's, chest pain, shortness of breath. Occasional palpitations. No dizziness (except when BP's low, prior to stopping amlodipine) Reflux is controlled, no recent n/v. Bowels are normal. +weakened stream and urinary frequency per HPI,  Chronic tinnitus and decreased hearing L ear. Mild LBP (d/t OA and scoliosis) No bleeding/bruising/rash or other skin concerns. Moods are good.    PHYSICAL EXAM:  There were no vitals taken for this visit.  Wt Readings from  Last 3 Encounters:  07/20/23 177 lb 12.8 oz (80.6 kg)  04/08/22 180 lb (81.6 kg)  06/15/21 173 lb (78.5 kg)     General Appearance:    Alert, cooperative, no distress, appears stated age  Head:    Normocephalic, without obvious abnormality, atraumatic  Eyes:    PERRL, conjunctiva/corneas clear, EOM's intact, fundi    benign  Ears:    Normal TM's and external ear canals  Nose:   Nares normal, mucosa normal, no drainage or sinus   tenderness  Throat:   Lips, mucosa, and tongue normal; teeth and gums normal  Neck:   Supple, no lymphadenopathy;  thyroid:  no   enlargement/tenderness/nodules; no carotid   bruit or JVD  Back:    Spine nontender, no curvature, ROM normal, no CVA     tenderness  Lungs:     Clear to auscultation bilaterally without wheezes, rales or     ronchi; respirations unlabored  Chest Wall:    No tenderness or deformity   Heart:    Regular rate and rhythm, S1 and S2 normal, no murmur, rub   or gallop  Breast Exam:    No chest wall tenderness, masses or gynecomastia  Abdomen:     Soft, non-tender, nondistended, normoactive bowel sounds,    no masses, no hepatosplenomegaly  Genitalia:    Normal male external genitalia without lesions.  Testicles without masses.  No inguinal hernias.  Rectal:    Normal sphincter tone, no masses or tenderness; guaiac negative stool.  Prostate smooth, no nodules, not enlarged.  Extremities:   No clubbing, cyanosis or edema  Pulses:   2+ and symmetric all extremities  Skin:   Skin color, texture, turgor normal, no rashes or lesions  Lymph nodes:   Cervical, supraclavicular, and axillary nodes normal  Neurologic:   CNII-XII intact, normal strength, sensation and gait; reflexes 2+ and symmetric throughout          Psych:   Normal mood, affect, hygiene and grooming.     ***UPDATE ALL R cerumen???    ASSESSMENT/PLAN:  A1c POC today (ordered; you can selectively sign that one to result it, or send me message with result and enter result  later)  Has he had diabetic eye exam in the last year? If so, need to request it    Should need RF on metformin , olmesartan , cialis , nexium   ***RF crestor  after labs back  Discussed PSA screening (risks/benefits), recommended at least 30 minutes of aerobic activity at least 5 days/week, weight-bearing exercise at least 2x/week; proper sunscreen use reviewed; healthy diet and alcohol recommendations (none, alcoholic) reviewed; regular seatbelt use; changing batteries in smoke detectors.   Immunization recommendations discussed--continue yearly flu shots, COVID boosters in the Fall.   Colonoscopy recommendations reviewed, colonoscopy due again 05/2024.   CPE 1 year Med check==

## 2023-10-26 ENCOUNTER — Ambulatory Visit (INDEPENDENT_AMBULATORY_CARE_PROVIDER_SITE_OTHER): Admitting: Family Medicine

## 2023-10-26 ENCOUNTER — Encounter: Payer: Self-pay | Admitting: Family Medicine

## 2023-10-26 VITALS — BP 118/80 | HR 67 | Ht 67.0 in | Wt 169.8 lb

## 2023-10-26 DIAGNOSIS — Z23 Encounter for immunization: Secondary | ICD-10-CM | POA: Diagnosis not present

## 2023-10-26 DIAGNOSIS — F1021 Alcohol dependence, in remission: Secondary | ICD-10-CM

## 2023-10-26 DIAGNOSIS — E782 Mixed hyperlipidemia: Secondary | ICD-10-CM | POA: Diagnosis not present

## 2023-10-26 DIAGNOSIS — K219 Gastro-esophageal reflux disease without esophagitis: Secondary | ICD-10-CM

## 2023-10-26 DIAGNOSIS — I10 Essential (primary) hypertension: Secondary | ICD-10-CM | POA: Diagnosis not present

## 2023-10-26 DIAGNOSIS — Z125 Encounter for screening for malignant neoplasm of prostate: Secondary | ICD-10-CM

## 2023-10-26 DIAGNOSIS — Z0000 Encounter for general adult medical examination without abnormal findings: Secondary | ICD-10-CM | POA: Diagnosis not present

## 2023-10-26 DIAGNOSIS — F1090 Alcohol use, unspecified, uncomplicated: Secondary | ICD-10-CM

## 2023-10-26 DIAGNOSIS — Z5181 Encounter for therapeutic drug level monitoring: Secondary | ICD-10-CM | POA: Diagnosis not present

## 2023-10-26 DIAGNOSIS — E119 Type 2 diabetes mellitus without complications: Secondary | ICD-10-CM | POA: Diagnosis not present

## 2023-10-26 DIAGNOSIS — R350 Frequency of micturition: Secondary | ICD-10-CM

## 2023-10-26 DIAGNOSIS — N401 Enlarged prostate with lower urinary tract symptoms: Secondary | ICD-10-CM

## 2023-10-26 LAB — POCT GLYCOSYLATED HEMOGLOBIN (HGB A1C): Hemoglobin A1C: 7.5 % — AB (ref 4.0–5.6)

## 2023-10-26 LAB — LIPID PANEL

## 2023-10-26 MED ORDER — TADALAFIL 2.5 MG PO TABS: 2.5000 mg | ORAL_TABLET | Freq: Two times a day (BID) | ORAL | 1 refills | Status: DC

## 2023-10-26 MED ORDER — ESOMEPRAZOLE MAGNESIUM 40 MG PO CPDR: 40.0000 mg | DELAYED_RELEASE_CAPSULE | Freq: Every day | ORAL | 0 refills | Status: DC

## 2023-10-26 MED ORDER — OLMESARTAN MEDOXOMIL 20 MG PO TABS: 20.0000 mg | ORAL_TABLET | Freq: Every day | ORAL | 0 refills | Status: DC

## 2023-10-27 ENCOUNTER — Ambulatory Visit: Payer: Self-pay | Admitting: Family Medicine

## 2023-10-27 LAB — CBC WITH DIFFERENTIAL/PLATELET
Basophils Absolute: 0 x10E3/uL (ref 0.0–0.2)
Basos: 1 %
EOS (ABSOLUTE): 0.1 x10E3/uL (ref 0.0–0.4)
Eos: 1 %
Hematocrit: 48.3 % (ref 37.5–51.0)
Hemoglobin: 15.5 g/dL (ref 13.0–17.7)
Immature Grans (Abs): 0.1 x10E3/uL (ref 0.0–0.1)
Immature Granulocytes: 1 %
Lymphocytes Absolute: 1.2 x10E3/uL (ref 0.7–3.1)
Lymphs: 24 %
MCH: 28 pg (ref 26.6–33.0)
MCHC: 32.1 g/dL (ref 31.5–35.7)
MCV: 87 fL (ref 79–97)
Monocytes Absolute: 0.3 x10E3/uL (ref 0.1–0.9)
Monocytes: 7 %
Neutrophils Absolute: 3.4 x10E3/uL (ref 1.4–7.0)
Neutrophils: 66 %
Platelets: 232 x10E3/uL (ref 150–450)
RBC: 5.54 x10E6/uL (ref 4.14–5.80)
RDW: 13.1 % (ref 11.6–15.4)
WBC: 5.1 x10E3/uL (ref 3.4–10.8)

## 2023-10-27 LAB — COMPREHENSIVE METABOLIC PANEL WITH GFR
ALT: 19 IU/L (ref 0–44)
AST: 19 IU/L (ref 0–40)
Albumin: 4.9 g/dL (ref 3.8–4.9)
Alkaline Phosphatase: 55 IU/L (ref 44–121)
BUN/Creatinine Ratio: 18 (ref 9–20)
BUN: 16 mg/dL (ref 6–24)
Bilirubin Total: 0.4 mg/dL (ref 0.0–1.2)
CO2: 21 mmol/L (ref 20–29)
Calcium: 10 mg/dL (ref 8.7–10.2)
Chloride: 100 mmol/L (ref 96–106)
Creatinine, Ser: 0.88 mg/dL (ref 0.76–1.27)
Globulin, Total: 2.3 g/dL (ref 1.5–4.5)
Glucose: 143 mg/dL — AB (ref 70–99)
Potassium: 4 mmol/L (ref 3.5–5.2)
Sodium: 137 mmol/L (ref 134–144)
Total Protein: 7.2 g/dL (ref 6.0–8.5)
eGFR: 99 mL/min/1.73 (ref >59)

## 2023-10-27 LAB — MICROALBUMIN / CREATININE URINE RATIO
Creatinine, Urine: 91.3 mg/dL
Microalb/Creat Ratio: 11 mg/g{creat} (ref 0–29)
Microalbumin, Urine: 9.7 ug/mL

## 2023-10-27 LAB — TSH: TSH: 1.34 u[IU]/mL (ref 0.450–4.500)

## 2023-10-27 LAB — PSA: Prostate Specific Ag, Serum: 0.8 ng/mL (ref 0.0–4.0)

## 2023-10-27 LAB — LIPID PANEL
Cholesterol, Total: 165 mg/dL (ref 100–199)
HDL: 54 mg/dL (ref >39)
LDL CALC COMMENT:: 3.1 ratio (ref 0.0–5.0)
LDL Chol Calc (NIH): 85 mg/dL (ref 0–99)
Triglycerides: 148 mg/dL (ref 0–149)
VLDL Cholesterol Cal: 26 mg/dL (ref 5–40)

## 2023-10-27 LAB — VITAMIN B12: Vitamin B-12: 499 pg/mL (ref 232–1245)

## 2023-10-27 LAB — MAGNESIUM: Magnesium: 2.1 mg/dL (ref 1.6–2.3)

## 2023-10-27 MED ORDER — ROSUVASTATIN CALCIUM 10 MG PO TABS: 10.0000 mg | ORAL_TABLET | Freq: Every day | ORAL | 3 refills | Status: DC

## 2023-11-30 ENCOUNTER — Other Ambulatory Visit: Payer: Self-pay | Admitting: *Deleted

## 2023-11-30 ENCOUNTER — Telehealth: Payer: Self-pay | Admitting: *Deleted

## 2023-11-30 DIAGNOSIS — N401 Enlarged prostate with lower urinary tract symptoms: Secondary | ICD-10-CM

## 2023-11-30 DIAGNOSIS — I10 Essential (primary) hypertension: Secondary | ICD-10-CM

## 2023-11-30 DIAGNOSIS — K219 Gastro-esophageal reflux disease without esophagitis: Secondary | ICD-10-CM

## 2023-11-30 MED ORDER — TADALAFIL 2.5 MG PO TABS: 2.5000 mg | ORAL_TABLET | Freq: Two times a day (BID) | ORAL | 0 refills | Status: DC

## 2023-11-30 MED ORDER — ESOMEPRAZOLE MAGNESIUM 40 MG PO CPDR: 40.0000 mg | DELAYED_RELEASE_CAPSULE | Freq: Every day | ORAL | 0 refills | Status: DC

## 2023-11-30 MED ORDER — METFORMIN HCL ER 500 MG PO TB24: 500.0000 mg | ORAL_TABLET | Freq: Two times a day (BID) | ORAL | 0 refills | Status: DC

## 2023-11-30 MED ORDER — OLMESARTAN MEDOXOMIL 20 MG PO TABS
20.0000 mg | ORAL_TABLET | Freq: Every day | ORAL | 0 refills | Status: DC
Start: 2023-11-30 — End: 2024-02-15

## 2023-11-30 MED ORDER — ROSUVASTATIN CALCIUM 10 MG PO TABS
10.0000 mg | ORAL_TABLET | Freq: Every day | ORAL | 0 refills | Status: DC
Start: 2023-11-30 — End: 2024-02-15

## 2023-11-30 NOTE — Telephone Encounter (Signed)
 All sent and patient informed.

## 2023-11-30 NOTE — Telephone Encounter (Signed)
 CRM: Reason for CRM: Patient called states requesting all medication (pills only / not eye drops) be transferred to [facility] mail order due to changing insurance. Current pharmacy not able to fill 90 days. Also said, no rush and tell Dr Randol he said hello. Thank You    Is this ok? Patient has appt in Nov.

## 2023-11-30 NOTE — Telephone Encounter (Signed)
 Yes, okay to switch his chronic meds to [facility]

## 2023-12-03 ENCOUNTER — Other Ambulatory Visit (HOSPITAL_COMMUNITY): Payer: Self-pay

## 2023-12-04 ENCOUNTER — Telehealth: Payer: Self-pay

## 2023-12-04 NOTE — Telephone Encounter (Signed)
 Pharmacy Patient Advocate Encounter   Received notification from CoverMyMeds that prior authorization for Tadalafil  2.5MG  tabletsis required/requested.   Insurance verification completed.   The patient is insured through [employer] .   Per test claim: PA required; PA submitted to above mentioned insurance via Latent Key/confirmation #/EOC [id number]   Status is pending

## 2023-12-05 ENCOUNTER — Encounter: Payer: Self-pay | Admitting: Family Medicine

## 2023-12-09 ENCOUNTER — Other Ambulatory Visit (HOSPITAL_COMMUNITY): Payer: Self-pay

## 2023-12-09 NOTE — Telephone Encounter (Signed)
 Pharmacy Patient Advocate Encounter  Received notification from [employer] that Prior Authorization for Tadalafil  2.5MG  tablets has been APPROVED from 8.12.25 to 8.26.26. Ran test claim, Copay is $RTS RX WAS LAST FILLED ON TODAY 8.27.25 VIA MAIL. This test claim was processed through [facility]- copay amounts may vary at other pharmacies due to pharmacy/plan contracts, or as the patient moves through the different stages of their insurance plan.   PA #/Case ID/Reference #: [id number] VIA LATENT

## 2024-01-14 ENCOUNTER — Other Ambulatory Visit (HOSPITAL_BASED_OUTPATIENT_CLINIC_OR_DEPARTMENT_OTHER): Payer: Self-pay

## 2024-01-14 MED ORDER — FLUZONE HIGH-DOSE 0.5 ML IM SUSY
0.5000 mL | PREFILLED_SYRINGE | Freq: Once | INTRAMUSCULAR | 0 refills | Status: AC
  Filled 2024-01-14: qty 0.5, 1d supply, fill #0

## 2024-01-14 MED ORDER — COMIRNATY 30 MCG/0.3ML IM SUSY
0.3000 mL | PREFILLED_SYRINGE | Freq: Once | INTRAMUSCULAR | 0 refills | Status: AC
  Filled 2024-01-14: qty 0.3, 1d supply, fill #0

## 2024-02-12 ENCOUNTER — Other Ambulatory Visit: Payer: Self-pay | Admitting: Family Medicine

## 2024-02-12 DIAGNOSIS — K219 Gastro-esophageal reflux disease without esophagitis: Secondary | ICD-10-CM

## 2024-02-12 DIAGNOSIS — I10 Essential (primary) hypertension: Secondary | ICD-10-CM

## 2024-02-12 NOTE — Telephone Encounter (Signed)
 These meds were filled on 11/30/23 #90. Pt has an appt November 12th. Pt should not be out of refills until 03/01/24

## 2024-02-15 ENCOUNTER — Other Ambulatory Visit: Payer: Self-pay | Admitting: Family Medicine

## 2024-02-15 DIAGNOSIS — K219 Gastro-esophageal reflux disease without esophagitis: Secondary | ICD-10-CM

## 2024-02-15 DIAGNOSIS — I10 Essential (primary) hypertension: Secondary | ICD-10-CM

## 2024-02-15 NOTE — Telephone Encounter (Unsigned)
 Copied from CRM #[id number]. Topic: Clinical - Medication Refill >> Feb 15, 2024  2:21 PM Yolanda T wrote: Patient is aware its early but does not want to wait until his appt as he will not get his medication in the mail in time. Patient said he promise to show up to his appt   Medication: esomeprazole  (NEXIUM ) 40 MG capsule, metFORMIN  (GLUCOPHAGE -XR) 500 MG 24 hr tablet, olmesartan  (BENICAR ) 20 MG tablet and rosuvastatin  (CRESTOR ) 10 MG tablet  Has the patient contacted their pharmacy? No  This is the patient's preferred pharmacy:  [facility] - [place], [place] - [address] [place] [address] Phone: [phone] Fax: [phone]  Is this the correct pharmacy for this prescription? Yes If no, delete pharmacy and type the correct one.   Has the prescription been filled recently? Yes  Is the patient out of the medication? No  Has the patient been seen for an appointment in the last year OR does the patient have an upcoming appointment? Yes  Can we respond through MyChart? Yes  Agent: Please be advised that Rx refills may take up to 3 business days. We ask that you follow-up with your pharmacy.

## 2024-02-16 MED ORDER — ROSUVASTATIN CALCIUM 10 MG PO TABS: 10.0000 mg | ORAL_TABLET | Freq: Every day | ORAL | 0 refills | Status: DC

## 2024-02-16 MED ORDER — METFORMIN HCL ER 500 MG PO TB24: 500.0000 mg | ORAL_TABLET | Freq: Two times a day (BID) | ORAL | 0 refills | Status: DC

## 2024-02-16 MED ORDER — OLMESARTAN MEDOXOMIL 20 MG PO TABS: 20.0000 mg | ORAL_TABLET | Freq: Every day | ORAL | 0 refills | Status: DC

## 2024-02-16 MED ORDER — ESOMEPRAZOLE MAGNESIUM 40 MG PO CPDR: 40.0000 mg | DELAYED_RELEASE_CAPSULE | Freq: Every day | ORAL | 0 refills | Status: DC

## 2024-02-19 ENCOUNTER — Other Ambulatory Visit: Payer: Self-pay | Admitting: Family Medicine

## 2024-02-19 DIAGNOSIS — N401 Enlarged prostate with lower urinary tract symptoms: Secondary | ICD-10-CM

## 2024-02-19 NOTE — Telephone Encounter (Signed)
 Pt should not be out yet. Has upcoming appt

## 2024-02-23 NOTE — Progress Notes (Unsigned)
 No chief complaint on file.  Patient presents for 4 month f/u on chronic problems.  Diabetes: A1c in July was elevated at 7.5 when taking 500 mg once daily (but down from 8.2 in 07/2023, when having more candy in rehab).   His dose was increased to 1000 mg daily  ***500 BID vs 1000 mg prior to dinner?***  Sugars are running ***  At CPE reported--Friday night is a splurge night--fatty red meat, small ice cream. Doesn't check it that night.  Saturday morning it is down to 100-120.  Occasional cookie at [facility] meeting. Now has to go to only 4 meetings/week.  Probably has 2-3 cookies/week.   Eye exam 04/2023, no diabetic retinopathy.   Hypertension: He reports compliance with benicar  20 mg daily. He denies dizziness, headaches (occasional stress HA), chest pain. He tries to follow low sodium diet.  BP's have been running ***   He continues to exercise regularly: Aerobic workout 3x/week --1.5 hours on the weekends, less than an hour on his half day of work, no exercise on work-days. Mostly has been power-walking with his wife (previously would also cycle). No weight-bearing exercise  ***UPDATE  BP Readings from Last 3 Encounters:  10/26/23 118/80  07/20/23 120/80  04/09/22 (!) 134/91     Hyperlipidemia:  he is compliant with rosuvastatin , denies side effects. Lipids in July were at goal (despite not being entirely fasting). Tries to follow a low cholesterol diet. CT angio 07/2020--no CAD, calcium  score of 0  Lab Results  Component Value Date   CHOL 165 10/26/2023   HDL 54 10/26/2023   LDLCALC 85 10/26/2023   TRIG 148 10/26/2023   CHOLHDL 3.1 10/26/2023     PMH, PSH, SH reviewed   ROS:    PHYSICAL EXAM:  There were no vitals taken for this visit.  Wt Readings from Last 3 Encounters:  10/26/23 169 lb 12.8 oz (77 kg)  07/20/23 177 lb 12.8 oz (80.6 kg)  04/08/22 180 lb (81.6 kg)     DIABETIC FOOT EXAM  ASSESSMENT/PLAN:  DIABETIC FOOT EXAM A1c  All meds  refilled 11/4 (could have refilled the statin for the year!)

## 2024-02-24 ENCOUNTER — Encounter: Payer: Self-pay | Admitting: Family Medicine

## 2024-02-24 ENCOUNTER — Ambulatory Visit (INDEPENDENT_AMBULATORY_CARE_PROVIDER_SITE_OTHER): Payer: Self-pay | Admitting: Family Medicine

## 2024-02-24 VITALS — BP 110/60 | HR 80 | Ht 67.0 in | Wt 176.8 lb

## 2024-02-24 DIAGNOSIS — E1159 Type 2 diabetes mellitus with other circulatory complications: Secondary | ICD-10-CM | POA: Diagnosis not present

## 2024-02-24 DIAGNOSIS — I152 Hypertension secondary to endocrine disorders: Secondary | ICD-10-CM

## 2024-02-24 DIAGNOSIS — E1169 Type 2 diabetes mellitus with other specified complication: Secondary | ICD-10-CM

## 2024-02-24 DIAGNOSIS — Z7984 Long term (current) use of oral hypoglycemic drugs: Secondary | ICD-10-CM | POA: Diagnosis not present

## 2024-02-24 DIAGNOSIS — E785 Hyperlipidemia, unspecified: Secondary | ICD-10-CM

## 2024-02-24 DIAGNOSIS — F1021 Alcohol dependence, in remission: Secondary | ICD-10-CM | POA: Diagnosis not present

## 2024-02-24 LAB — POCT GLYCOSYLATED HEMOGLOBIN (HGB A1C): Hemoglobin A1C: 6.9 % — AB (ref 4.0–5.6)

## 2024-02-24 NOTE — Patient Instructions (Addendum)
 Try taking the metformin  before a meal (either dinner or breakfast), rather than at bedtime.  Try and limit your snacking in the evenings.  If still hungry after dinner have a salad, or more veggies rather than carbs. Go upstairs, away from the [place] and pantry. Try and wean down from the diet sodas, which can trigger cravings.

## 2024-03-30 ENCOUNTER — Other Ambulatory Visit: Payer: Self-pay | Admitting: Family Medicine

## 2024-03-30 DIAGNOSIS — N401 Enlarged prostate with lower urinary tract symptoms: Secondary | ICD-10-CM

## 2024-03-30 NOTE — Telephone Encounter (Signed)
 Copied from CRM #[id number]. Topic: Clinical - Medication Refill >> Mar 30, 2024 12:05 PM Everette C wrote: Medication: Tadalafil  2.5 MG TABS [[id number]]  Has the patient contacted their pharmacy? Yes (Agent: If no, request that the patient contact the pharmacy for the refill. If patient does not wish to contact the pharmacy document the reason why and proceed with request.) (Agent: If yes, when and what did the pharmacy advise?)  This is the patient's preferred pharmacy:  [facility] - [place], [place] - [address] [place] [address] Phone: [phone] Fax: [phone]  Is this the correct pharmacy for this prescription? Yes If no, delete pharmacy and type the correct one.   Has the prescription been filled recently? Yes  Is the patient out of the medication? Yes  Has the patient been seen for an appointment in the last year OR does the patient have an upcoming appointment? Yes  Can we respond through MyChart? No  Agent: Please be advised that Rx refills may take up to 3 business days. We ask that you follow-up with your pharmacy.  The patient apologizes for their late request but stresses the urgency of their request and the need for continued use

## 2024-03-30 NOTE — Telephone Encounter (Signed)
 Is this okay to refill, asking for #180.

## 2024-03-30 NOTE — Telephone Encounter (Signed)
° °  This was filled for #180

## 2024-04-20 ENCOUNTER — Telehealth: Payer: Self-pay | Admitting: Family Medicine

## 2024-04-20 NOTE — Telephone Encounter (Signed)
 Okay for referral as requested. Per documentation at last CPE-- Last colonoscopy was in 05/2019 with Dr. Larene ([place])--inflamed hemorrhoids. H/o polyps in 2016. 5 yr f/u recommended

## 2024-04-20 NOTE — Telephone Encounter (Signed)
 Copied from CRM [id number]. Topic: Referral - Request for Referral >> Apr 20, 2024  3:25 PM Antwanette L wrote: Did the patient discuss referral with their provider in the last year? Yes (If No - schedule appointment) (If Yes - send message)  Appointment offered? No  Type of order/referral and detailed reason for visit: Colonoscopy  Preference of office, provider, location: Patient would like to see Dr. Lynnie Bring at  [facility] ([address] [place], [place] [address])  If referral order, have you been seen by this specialty before? Yes. Last colonoscopy was done on 2021 at [facility]   Can we respond through MyChart? No. Patient can be reached at [phone]

## 2024-04-21 ENCOUNTER — Other Ambulatory Visit: Payer: Self-pay | Admitting: *Deleted

## 2024-04-21 DIAGNOSIS — Z1211 Encounter for screening for malignant neoplasm of colon: Secondary | ICD-10-CM

## 2024-04-21 NOTE — Telephone Encounter (Signed)
Referral done and patient advised.

## 2024-05-11 LAB — OPHTHALMOLOGY REPORT-SCANNED

## 2024-05-16 ENCOUNTER — Other Ambulatory Visit: Payer: Self-pay | Admitting: Family Medicine

## 2024-05-16 DIAGNOSIS — K219 Gastro-esophageal reflux disease without esophagitis: Secondary | ICD-10-CM

## 2024-05-16 DIAGNOSIS — I10 Essential (primary) hypertension: Secondary | ICD-10-CM

## 2024-05-18 NOTE — Telephone Encounter (Unsigned)
 Copied from CRM #[id number]. Topic: General - Other >> May 18, 2024  2:44 PM Hadassah PARAS wrote: Reason for CRM: Pt received a message from myChart. I advised the only message I see here is from the medication approval 2 days ago. Also regarding GI referral, pt is only available on Wednesday. Please call pt back to update on #[phone]

## 2024-06-29 ENCOUNTER — Ambulatory Visit: Admitting: Family Medicine

## 2024-10-26 ENCOUNTER — Encounter: Payer: Self-pay | Admitting: Family Medicine
